# Patient Record
Sex: Female | Born: 1955 | Race: White | Hispanic: No | Smoking: Current every day smoker
Health system: Southern US, Community
[De-identification: ages and names within clinical notes are randomized; demographics above are authoritative.]

## PROBLEM LIST (undated history)

## (undated) DIAGNOSIS — J984 Other disorders of lung: Secondary | ICD-10-CM

## (undated) DIAGNOSIS — M1990 Unspecified osteoarthritis, unspecified site: Secondary | ICD-10-CM

## (undated) DIAGNOSIS — E079 Disorder of thyroid, unspecified: Secondary | ICD-10-CM

## (undated) DIAGNOSIS — K219 Gastro-esophageal reflux disease without esophagitis: Secondary | ICD-10-CM

## (undated) DIAGNOSIS — H53149 Visual discomfort, unspecified: Secondary | ICD-10-CM

## (undated) DIAGNOSIS — F32A Depression, unspecified: Secondary | ICD-10-CM

## (undated) DIAGNOSIS — G43909 Migraine, unspecified, not intractable, without status migrainosus: Secondary | ICD-10-CM

## (undated) DIAGNOSIS — E7800 Pure hypercholesterolemia, unspecified: Secondary | ICD-10-CM

## (undated) DIAGNOSIS — M797 Fibromyalgia: Secondary | ICD-10-CM

## (undated) DIAGNOSIS — G8929 Other chronic pain: Secondary | ICD-10-CM

## (undated) DIAGNOSIS — I509 Heart failure, unspecified: Secondary | ICD-10-CM

## (undated) DIAGNOSIS — I10 Essential (primary) hypertension: Secondary | ICD-10-CM

## (undated) DIAGNOSIS — F329 Major depressive disorder, single episode, unspecified: Secondary | ICD-10-CM

## (undated) DIAGNOSIS — M545 Low back pain, unspecified: Secondary | ICD-10-CM

## (undated) DIAGNOSIS — Z950 Presence of cardiac pacemaker: Secondary | ICD-10-CM

## (undated) DIAGNOSIS — J449 Chronic obstructive pulmonary disease, unspecified: Secondary | ICD-10-CM

## (undated) DIAGNOSIS — R0602 Shortness of breath: Secondary | ICD-10-CM

---
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
## 1991-03-31 HISTORY — PX: ABDOMINAL HYSTERECTOMY: SHX81

## 1998-08-19 ENCOUNTER — Inpatient Hospital Stay (HOSPITAL_COMMUNITY): Admission: EM | Admit: 1998-08-19 | Discharge: 1998-08-23 | Payer: Self-pay | Admitting: Psychiatry

## 1998-08-26 ENCOUNTER — Encounter (HOSPITAL_COMMUNITY): Admission: RE | Admit: 1998-08-26 | Discharge: 1998-09-02 | Payer: Self-pay | Admitting: Psychiatry

## 1998-11-29 HISTORY — PX: CARPAL TUNNEL RELEASE: SHX101

## 1998-11-29 HISTORY — PX: ROUX-EN-Y GASTRIC BYPASS: SHX1104

## 1998-11-29 HISTORY — PX: THYROIDECTOMY, PARTIAL: SHX18

## 2000-01-21 ENCOUNTER — Emergency Department (HOSPITAL_COMMUNITY): Admission: EM | Admit: 2000-01-21 | Discharge: 2000-01-21 | Payer: Self-pay | Admitting: Emergency Medicine

## 2000-05-03 ENCOUNTER — Encounter: Payer: Self-pay | Admitting: Emergency Medicine

## 2000-05-03 ENCOUNTER — Emergency Department (HOSPITAL_COMMUNITY): Admission: EM | Admit: 2000-05-03 | Discharge: 2000-05-03 | Payer: Self-pay | Admitting: Emergency Medicine

## 2000-07-16 ENCOUNTER — Other Ambulatory Visit (HOSPITAL_COMMUNITY): Admission: RE | Admit: 2000-07-16 | Discharge: 2000-07-26 | Payer: Self-pay | Admitting: Psychiatry

## 2001-06-02 ENCOUNTER — Ambulatory Visit (HOSPITAL_BASED_OUTPATIENT_CLINIC_OR_DEPARTMENT_OTHER): Admission: RE | Admit: 2001-06-02 | Discharge: 2001-06-02 | Payer: Self-pay | Admitting: Orthopedic Surgery

## 2001-09-27 ENCOUNTER — Encounter: Payer: Self-pay | Admitting: Emergency Medicine

## 2001-09-27 ENCOUNTER — Emergency Department (HOSPITAL_COMMUNITY): Admission: EM | Admit: 2001-09-27 | Discharge: 2001-09-27 | Payer: Self-pay | Admitting: Emergency Medicine

## 2001-12-20 ENCOUNTER — Ambulatory Visit (HOSPITAL_BASED_OUTPATIENT_CLINIC_OR_DEPARTMENT_OTHER): Admission: RE | Admit: 2001-12-20 | Discharge: 2001-12-20 | Payer: Self-pay | Admitting: Internal Medicine

## 2002-01-31 ENCOUNTER — Encounter: Payer: Self-pay | Admitting: Emergency Medicine

## 2002-01-31 ENCOUNTER — Inpatient Hospital Stay (HOSPITAL_COMMUNITY): Admission: EM | Admit: 2002-01-31 | Discharge: 2002-02-01 | Payer: Self-pay | Admitting: Emergency Medicine

## 2002-02-01 ENCOUNTER — Encounter: Payer: Self-pay | Admitting: Cardiovascular Disease

## 2002-03-01 ENCOUNTER — Encounter: Admission: RE | Admit: 2002-03-01 | Discharge: 2002-05-30 | Payer: Self-pay | Admitting: Internal Medicine

## 2002-03-03 ENCOUNTER — Encounter: Payer: Self-pay | Admitting: Surgery

## 2002-03-08 ENCOUNTER — Ambulatory Visit (HOSPITAL_COMMUNITY): Admission: RE | Admit: 2002-03-08 | Discharge: 2002-03-09 | Payer: Self-pay | Admitting: Otolaryngology

## 2002-04-19 ENCOUNTER — Ambulatory Visit (HOSPITAL_BASED_OUTPATIENT_CLINIC_OR_DEPARTMENT_OTHER): Admission: RE | Admit: 2002-04-19 | Discharge: 2002-04-19 | Payer: Self-pay | Admitting: Internal Medicine

## 2002-09-28 ENCOUNTER — Encounter: Payer: Self-pay | Admitting: Cardiology

## 2002-09-28 ENCOUNTER — Inpatient Hospital Stay (HOSPITAL_COMMUNITY): Admission: EM | Admit: 2002-09-28 | Discharge: 2002-09-30 | Payer: Self-pay | Admitting: Emergency Medicine

## 2002-09-29 ENCOUNTER — Encounter: Payer: Self-pay | Admitting: Cardiology

## 2002-10-30 ENCOUNTER — Encounter: Payer: Self-pay | Admitting: Emergency Medicine

## 2002-10-30 ENCOUNTER — Emergency Department (HOSPITAL_COMMUNITY): Admission: EM | Admit: 2002-10-30 | Discharge: 2002-10-30 | Payer: Self-pay | Admitting: Emergency Medicine

## 2002-11-10 ENCOUNTER — Encounter: Payer: Self-pay | Admitting: Emergency Medicine

## 2002-11-10 ENCOUNTER — Emergency Department (HOSPITAL_COMMUNITY): Admission: EM | Admit: 2002-11-10 | Discharge: 2002-11-10 | Payer: Self-pay | Admitting: Emergency Medicine

## 2003-03-21 ENCOUNTER — Ambulatory Visit (HOSPITAL_COMMUNITY): Admission: RE | Admit: 2003-03-21 | Discharge: 2003-03-21 | Payer: Self-pay | Admitting: Internal Medicine

## 2003-07-06 ENCOUNTER — Emergency Department (HOSPITAL_COMMUNITY): Admission: EM | Admit: 2003-07-06 | Discharge: 2003-07-06 | Payer: Self-pay | Admitting: Emergency Medicine

## 2003-09-12 ENCOUNTER — Ambulatory Visit (HOSPITAL_COMMUNITY): Admission: RE | Admit: 2003-09-12 | Discharge: 2003-09-12 | Payer: Self-pay | Admitting: *Deleted

## 2003-09-17 ENCOUNTER — Ambulatory Visit (HOSPITAL_COMMUNITY): Admission: RE | Admit: 2003-09-17 | Discharge: 2003-09-17 | Payer: Self-pay | Admitting: Gastroenterology

## 2003-09-25 ENCOUNTER — Encounter: Admission: RE | Admit: 2003-09-25 | Discharge: 2003-12-24 | Payer: Self-pay | Admitting: *Deleted

## 2004-01-01 ENCOUNTER — Inpatient Hospital Stay (HOSPITAL_COMMUNITY): Admission: RE | Admit: 2004-01-01 | Discharge: 2004-01-06 | Payer: Self-pay | Admitting: *Deleted

## 2004-01-09 ENCOUNTER — Encounter: Admission: RE | Admit: 2004-01-09 | Discharge: 2004-03-26 | Payer: Self-pay | Admitting: *Deleted

## 2004-05-26 ENCOUNTER — Ambulatory Visit (HOSPITAL_COMMUNITY): Admission: RE | Admit: 2004-05-26 | Discharge: 2004-05-26 | Payer: Self-pay | Admitting: Internal Medicine

## 2004-06-18 ENCOUNTER — Encounter: Admission: RE | Admit: 2004-06-18 | Discharge: 2004-09-16 | Payer: Self-pay | Admitting: *Deleted

## 2011-03-21 ENCOUNTER — Emergency Department (HOSPITAL_COMMUNITY)
Admission: EM | Admit: 2011-03-21 | Discharge: 2011-03-21 | Disposition: A | Discharge status: Home / Self Care | Payer: Self-pay | Attending: Emergency Medicine | Admitting: Emergency Medicine

## 2011-03-21 ENCOUNTER — Encounter: Payer: Self-pay | Admitting: *Deleted

## 2011-03-21 ENCOUNTER — Emergency Department (HOSPITAL_COMMUNITY): Payer: Self-pay

## 2011-03-21 DIAGNOSIS — R0602 Shortness of breath: Secondary | ICD-10-CM | POA: Insufficient documentation

## 2011-03-21 DIAGNOSIS — R509 Fever, unspecified: Secondary | ICD-10-CM | POA: Insufficient documentation

## 2011-03-21 DIAGNOSIS — R059 Cough, unspecified: Secondary | ICD-10-CM | POA: Insufficient documentation

## 2011-03-21 DIAGNOSIS — R5383 Other fatigue: Secondary | ICD-10-CM | POA: Insufficient documentation

## 2011-03-21 DIAGNOSIS — J40 Bronchitis, not specified as acute or chronic: Secondary | ICD-10-CM | POA: Insufficient documentation

## 2011-03-21 DIAGNOSIS — R05 Cough: Secondary | ICD-10-CM | POA: Insufficient documentation

## 2011-03-21 DIAGNOSIS — R5381 Other malaise: Secondary | ICD-10-CM | POA: Insufficient documentation

## 2011-03-21 HISTORY — DX: Unspecified osteoarthritis, unspecified site: M19.90

## 2011-03-21 HISTORY — DX: Disorder of thyroid, unspecified: E07.9

## 2011-03-21 MED ORDER — HYDROCODONE-ACETAMINOPHEN 5-325 MG PO TABS: 2.0000 | ORAL_TABLET | ORAL | Status: AC | PRN

## 2011-03-21 MED ORDER — BENZONATATE 100 MG PO CAPS: 100.0000 mg | ORAL_CAPSULE | Freq: Three times a day (TID) | ORAL | Status: DC

## 2011-03-21 MED ORDER — AZITHROMYCIN 250 MG PO TABS: 250.0000 mg | ORAL_TABLET | Freq: Every day | ORAL | Status: AC

## 2011-03-21 MED ORDER — BENZONATATE 100 MG PO CAPS: 100.0000 mg | ORAL_CAPSULE | Freq: Three times a day (TID) | ORAL | Status: AC

## 2011-03-21 MED ORDER — AZITHROMYCIN 250 MG PO TABS: 250.0000 mg | ORAL_TABLET | Freq: Every day | ORAL | Status: DC

## 2011-03-21 NOTE — ED Provider Notes (Signed)
Medical screening examination/treatment/procedure(s) were performed by non-physician practitioner and as supervising physician I was immediately available for consultation/collaboration.   Laquida Cotrell R Dannica Bickham, MD 03/21/11 1641 

## 2011-03-21 NOTE — ED Provider Notes (Signed)
History     CSN: [id number]  Arrival date & time 03/21/11  1026   First MD Initiated Contact with Patient 03/21/11 1034      Chief Complaint  Patient presents with  . Fever  . Weakness  . Shortness of Breath    (Consider location/radiation/quality/duration/timing/severity/associated sxs/prior treatment) Patient is a 55 y.o. female presenting with fever, weakness, and shortness of breath. The history is provided by the patient.  Fever Primary symptoms of the febrile illness include fever and shortness of breath.  Weakness The primary symptoms include fever.  Additional symptoms include weakness.  Shortness of Breath  Associated symptoms include a fever and shortness of breath.    Since Thursday night the patient has been coughing, having fevers and chills, and body aches all over. She denies N/V/D, sore throat an ear pains. She has not taken her temperature but believes that she feels warm to the touch. She also states that she feels weak and "just plain bad". Pt is otherwise healthy.  Past Medical History  Diagnosis Date  . Thyroid disease   . Asthma   . Arthritis     Past Surgical History  Procedure Date  . Abdominal surgery   . Gastric bypass   . 1/2 of thyroid removed     Tumor was benign  . Bilateral carpal tunnel release   . Abdominal hysterectomy   . 2 c-sections   . Appendectomy     Family History  Problem Relation Age of Onset  . Cancer Mother   . Hyperlipidemia Mother   . Heart failure Mother     History  Substance Use Topics  . Smoking status: Current Everyday Smoker    Types: Cigarettes  . Smokeless tobacco: Never Used  . Alcohol Use: Yes     occ    OB History    Grav Para Term Preterm Abortions TAB SAB Ect Mult Living                  Review of Systems  Constitutional: Positive for fever.  Respiratory: Positive for shortness of breath.   Neurological: Positive for weakness.  All other systems reviewed and are  negative.    Allergies  Review of patient's allergies indicates no known allergies.  Home Medications  No current outpatient prescriptions on file.  BP 139/51  Pulse 66  Temp(Src) 98.9 F (37.2 C) (Oral)  Resp 18  SpO2 96%  Physical Exam  Constitutional: She is oriented to person, place, and time. She appears well-developed and well-nourished.  HENT:  Head: Normocephalic and atraumatic.  Eyes: Conjunctivae are normal. Pupils are equal, round, and reactive to light.  Neck: Trachea normal, normal range of motion and full passive range of motion without pain. Neck supple.  Cardiovascular: Normal rate, regular rhythm and normal pulses.   Pulmonary/Chest: Effort normal and breath sounds normal. No respiratory distress.   Chest wall is not dull to percussion. She exhibits no tenderness, no crepitus, no edema, no deformity and no retraction.  Abdominal: Soft. Normal appearance and bowel sounds are normal.  Musculoskeletal: Normal range of motion.  Neurological: She is alert and oriented to person, place, and time. She has normal strength.  Skin: Skin is warm, dry and intact.  Psychiatric: She has a normal mood and affect. Her speech is normal and behavior is normal. Judgment and thought content normal. Cognition and memory are normal.    ED Course  Procedures (including critical care time)  Labs Reviewed - No data to  display No results found.   1. Flu-like symptoms       MDM  Will treat patients symptoms and have her follow-up with her ED if she has any complications.        Dorthula Matas, PA 03/21/11 1217

## 2011-03-21 NOTE — ED Notes (Signed)
Patient returned from X-ray 

## 2011-06-20 ENCOUNTER — Emergency Department (HOSPITAL_COMMUNITY): Payer: Self-pay

## 2011-06-20 ENCOUNTER — Encounter (HOSPITAL_COMMUNITY): Payer: Self-pay | Admitting: *Deleted

## 2011-06-20 ENCOUNTER — Emergency Department (HOSPITAL_COMMUNITY)
Admission: EM | Admit: 2011-06-20 | Discharge: 2011-06-21 | Disposition: A | Discharge status: Home / Self Care | Payer: Self-pay | Attending: Emergency Medicine | Admitting: Emergency Medicine

## 2011-06-20 DIAGNOSIS — N12 Tubulo-interstitial nephritis, not specified as acute or chronic: Secondary | ICD-10-CM | POA: Insufficient documentation

## 2011-06-20 DIAGNOSIS — R300 Dysuria: Secondary | ICD-10-CM | POA: Insufficient documentation

## 2011-06-20 DIAGNOSIS — K8020 Calculus of gallbladder without cholecystitis without obstruction: Secondary | ICD-10-CM | POA: Insufficient documentation

## 2011-06-20 DIAGNOSIS — R911 Solitary pulmonary nodule: Secondary | ICD-10-CM | POA: Insufficient documentation

## 2011-06-20 DIAGNOSIS — J45909 Unspecified asthma, uncomplicated: Secondary | ICD-10-CM | POA: Insufficient documentation

## 2011-06-20 DIAGNOSIS — Z79899 Other long term (current) drug therapy: Secondary | ICD-10-CM | POA: Insufficient documentation

## 2011-06-20 DIAGNOSIS — E079 Disorder of thyroid, unspecified: Secondary | ICD-10-CM | POA: Insufficient documentation

## 2011-06-20 DIAGNOSIS — Z8739 Personal history of other diseases of the musculoskeletal system and connective tissue: Secondary | ICD-10-CM | POA: Insufficient documentation

## 2011-06-20 DIAGNOSIS — R109 Unspecified abdominal pain: Secondary | ICD-10-CM | POA: Insufficient documentation

## 2011-06-20 LAB — POCT I-STAT, CHEM 8
Creatinine, Ser: 0.7 mg/dL (ref 0.50–1.10)
Hemoglobin: 14.6 g/dL (ref 12.0–15.0)
Sodium: 144 mEq/L (ref 135–145)
TCO2: 26 mmol/L (ref 0–100)

## 2011-06-20 LAB — URINALYSIS, ROUTINE W REFLEX MICROSCOPIC
Glucose, UA: NEGATIVE mg/dL
Specific Gravity, Urine: 1.01 (ref 1.005–1.030)
pH: 7 (ref 5.0–8.0)

## 2011-06-20 LAB — URINE MICROSCOPIC-ADD ON

## 2011-06-20 MED ORDER — MORPHINE SULFATE 4 MG/ML IJ SOLN
4.0000 mg | Freq: Once | INTRAMUSCULAR | Status: AC
  Administered 2011-06-20: 4 mg via INTRAVENOUS
  Filled 2011-06-20: qty 1

## 2011-06-20 MED ORDER — DEXTROSE 5 % IV SOLN
1.0000 g | Freq: Once | INTRAVENOUS | Status: AC
  Administered 2011-06-20: 1 g via INTRAVENOUS
  Filled 2011-06-20: qty 10

## 2011-06-20 MED ORDER — ONDANSETRON HCL 4 MG/2ML IJ SOLN
4.0000 mg | Freq: Once | INTRAMUSCULAR | Status: AC
  Administered 2011-06-20: 4 mg via INTRAVENOUS
  Filled 2011-06-20: qty 2

## 2011-06-20 MED ORDER — SODIUM CHLORIDE 0.9 % IV BOLUS (SEPSIS)
1000.0000 mL | Freq: Once | INTRAVENOUS | Status: AC
  Administered 2011-06-20: 1000 mL via INTRAVENOUS

## 2011-06-20 NOTE — ED Provider Notes (Signed)
History     CSN: [id number]  Arrival date & time 06/20/11  2011   First MD Initiated Contact with Patient 06/20/11 2315      Chief Complaint  Patient presents with  . Abdominal Pain  . Dysuria    (Consider location/radiation/quality/duration/timing/severity/associated sxs/prior treatment) HPI Comments: Patient presents with lower abdominal pain has been intermittent for the past 10 days. Today he acutely got worse a move from her right side of her abdomen to her suprapubic area. She had multiple episodes of painful urination, frequency and urgency. She denies any nausea, vomiting, fever. No diarrhea or change in bowel habits. She's a history of gastric bypass surgery and appendectomy. She denies any chest pain or shortness of breath. She has had a dry intermittent cough for the past week that has exacerbated her abdominal pain. She describes her pain as "bladder spasms" nothing makes it better or worse.  The history is provided by the patient.    Past Medical History  Diagnosis Date  . Thyroid disease   . Asthma   . Arthritis     Past Surgical History  Procedure Date  . Abdominal surgery   . Gastric bypass   . 1/2 of thyroid removed     Tumor was benign  . Bilateral carpal tunnel release   . Abdominal hysterectomy   . 2 c-sections   . Appendectomy     Family History  Problem Relation Age of Onset  . Cancer Mother   . Hyperlipidemia Mother   . Heart failure Mother     History  Substance Use Topics  . Smoking status: Current Everyday Smoker    Types: Cigarettes  . Smokeless tobacco: Never Used  . Alcohol Use: Yes     occ    OB History    Grav Para Term Preterm Abortions TAB SAB Ect Mult Living                  Review of Systems  Constitutional: Positive for fever, activity change and appetite change.  HENT: Negative for congestion, sore throat and rhinorrhea.   Eyes: Negative for visual disturbance.  Respiratory: Positive for cough. Negative for chest  tightness and shortness of breath.   Cardiovascular: Negative for chest pain.  Gastrointestinal: Positive for abdominal pain. Negative for nausea, vomiting and diarrhea.  Genitourinary: Positive for dysuria and hematuria.  Musculoskeletal: Negative for back pain.  Neurological: Negative for headaches.    Allergies  Review of patient's allergies indicates no known allergies.  Home Medications   Current Outpatient Rx  Name Route Sig Dispense Refill  . CETIRIZINE HCL 10 MG PO TABS Oral Take 10 mg by mouth daily.      Marland Kitchen HYDROCORTISONE 1 % EX CREA Topical Apply 1 application topically as needed. For psoriasis.     . IBUPROFEN 200 MG PO TABS Oral Take 200 mg by mouth every 8 (eight) hours as needed. For fever or pain.     Carma Leaven M PLUS PO TABS Oral Take 1 tablet by mouth daily.      Marland Kitchen OVER THE COUNTER MEDICATION Oral Take 1 tablet by mouth daily. Vitamin B-12.     Marland Kitchen OVER THE COUNTER MEDICATION Oral Take 1 tablet by mouth daily. Calcium tablet.     Marland Kitchen OVER THE COUNTER MEDICATION Oral Take 1 tablet by mouth daily. Vitamin D.       BP 127/50  Pulse 63  Temp(Src) 98.5 F (36.9 C) (Oral)  Resp 17  SpO2 100%  Physical Exam  Constitutional: She is oriented to person, place, and time. She appears well-developed and well-nourished. No distress.  HENT:  Head: Normocephalic and atraumatic.  Mouth/Throat: No oropharyngeal exudate.  Eyes: Conjunctivae and EOM are normal. Pupils are equal, round, and reactive to light.  Neck: Normal range of motion.  Cardiovascular: Normal rate, regular rhythm and normal heart sounds.   No murmur heard. Pulmonary/Chest: Effort normal and breath sounds normal. No respiratory distress. She exhibits no tenderness.  Abdominal: Soft. There is tenderness. There is guarding.       Suprapubic and right lower quadrant tenderness with guarding. Negative McBurney's tenderness, negative Rovsing  Musculoskeletal: She exhibits tenderness.       Left CVA tenderness    Neurological: She is alert and oriented to person, place, and time. No cranial nerve deficit.  Skin: Skin is warm.    ED Course  Procedures (including critical care time)  Labs Reviewed  URINALYSIS, ROUTINE W REFLEX MICROSCOPIC - Abnormal; Notable for the following:    APPearance CLOUDY (*)    Hgb urine dipstick LARGE (*)    Leukocytes, UA LARGE (*)    All other components within normal limits  URINE MICROSCOPIC-ADD ON - Abnormal; Notable for the following:    Bacteria, UA MANY (*)    All other components within normal limits  POCT I-STAT, CHEM 8 - Abnormal; Notable for the following:    Calcium, Ion 1.34 (*)    All other components within normal limits   Dg Chest 2 View  06/21/2011  *RADIOLOGY REPORT*  Clinical Data: Cough.  Chest pain.  Asthma.  CHEST - 2 VIEW  Comparison:  03/21/2011  Findings:  Lordotic positioning noted on the PA view.  The heart size and mediastinal contours are within normal limits.  Both lungs are clear.  The visualized skeletal structures are unremarkable.  IMPRESSION: No active cardiopulmonary disease.  Original Report Authenticated By: Danae Orleans, M.D.   Ct Abdomen Pelvis W Contrast  06/21/2011  *RADIOLOGY REPORT*  Clinical Data: Pain, dysuria, history of gastric bypass  CT ABDOMEN AND PELVIS WITH CONTRAST  Technique:  Multidetector CT imaging of the abdomen and pelvis was performed following the standard protocol during bolus administration of intravenous contrast. Sagittal and coronal MPR images reconstructed from axial data set.  Contrast:  Dilute oral contrast. 100 ml Omnipaque 300 IV.  Comparison: None  Findings: 4 mm right lower lobe nodule image 1. Prior gastric surgery by history gastric bypass. Cholelithiasis without biliary dilatation. Patchy areas of abnormal nephrogram within both kidneys, raising question of pyelonephritis. Sharp margination of this lesion on delayed images favors mass. Liver, spleen, pancreas, kidneys, and adrenal glands  otherwise normal appearance. Stomach decompressed.  Short segment of small bowel enteroenteric intussusception in the jejunum in the left upper quadrant. Splenule inferior to spleen. Scattered atherosclerotic calcification. Uterus surgically absent bilateral atrophic ovaries. Appendix surgically absent.  Diffuse bladder wall thickening, fairly uniform, favor inflammatory process over tumor. Left inguinal hernia containing fat. No mass, adenopathy, free fluid or free air. Bones appear demineralized. Minimal osseous contents and iliac.  IMPRESSION: Cholelithiasis. Diffuse bladder wall thickening, favor inflammatory process but tumor is not excluded; recommend correlation with urinalysis and questionably follow-up cystoscopy. Short segment of small bowel enteroenteric intussusception in the jejunum in the left upper quadrant. Left inguinal hernia containing fat. Foci of abnormal nephrograms in both kidneys question pyelonephritis, recommend correlation urinalysis. 4 mm nonspecific right lower lobe pulmonary nodule, recommendation below.  If the patient is at high  risk for bronchogenic carcinoma, follow- up chest CT at 1 year is recommended.  If the patient is at low risk, no follow-up is needed.  This recommendation follows the consensus statement: Guidelines for Management of Small Pulmonary Nodules Detected on CT Scans:  A Statement from the Fleischner Society as published in Radiology 2005; 237:395-400.  Original Report Authenticated By: Lollie Marrow, M.D.     No diagnosis found.    MDM  Lower abdominal pain with dysuria and frequency. Vitals stable, abdomen soft, suprapubic tenderness.  Evaluate for UTI versus pyelonephritis versus nephrolithiasis.  Urinalysis infected. Treat for pyelonephritis. Given patient's history of gastric bypass as well as diffuse abdominal pain CT imaging was obtained. This is consistent with inflammation of the bladder and kidneys. She'll also is noted to have an incidental  jejunal intussusception. I discussed these findings with the general surgeon on call Dr. Purnell Shoemaker.  He states this is likely incidental peristalsis this patient is tolerating by mouth and has no clinical signs of bloating or obstruction. Patient will be instructed on warning signs to return. Patient is informed of these findings as well as lung nodule.     Glynn Octave, MD 06/21/11 [phone]

## 2011-06-20 NOTE — ED Notes (Signed)
Pt states cough x 1 week. Starting yesterday, every episode of coughing caused lower abdominal pain. Tonight, pt c/o dysuria and bladder spasms.

## 2011-06-21 MED ORDER — HYDROCODONE-ACETAMINOPHEN 5-325 MG PO TABS: 2.0000 | ORAL_TABLET | ORAL | Status: AC | PRN

## 2011-06-21 MED ORDER — ONDANSETRON HCL 4 MG PO TABS: 4.0000 mg | ORAL_TABLET | Freq: Four times a day (QID) | ORAL | Status: AC

## 2011-06-21 MED ORDER — CEPHALEXIN 500 MG PO CAPS: 500.0000 mg | ORAL_CAPSULE | Freq: Four times a day (QID) | ORAL | Status: AC

## 2011-06-21 MED ORDER — IOHEXOL 300 MG/ML  SOLN
100.0000 mL | Freq: Once | INTRAMUSCULAR | Status: AC | PRN
  Administered 2011-06-21: 100 mL via INTRAVENOUS

## 2011-06-21 MED ORDER — MORPHINE SULFATE 4 MG/ML IJ SOLN
4.0000 mg | Freq: Once | INTRAMUSCULAR | Status: AC
  Administered 2011-06-21: 4 mg via INTRAVENOUS
  Filled 2011-06-21: qty 1

## 2011-06-21 NOTE — ED Notes (Signed)
Patient given discharge instructions, information, prescriptions, and diet order. Patient states that they adequately understand discharge information given and to return to ED if symptoms return or worsen.     

## 2011-06-21 NOTE — Discharge Instructions (Signed)
Take the antibiotics as prescribed for a kidney infection. You have a lung nodule on her CT scan that needs to be followed up in one year. Stop smoking.  As we talked about her CT findings, return to the ED she developed abdominal bloating, vomiting, inability to pass stool, fever or any other symptoms.  Pyelonephritis, Adult Pyelonephritis is a kidney infection. In general, there are 2 main types of pyelonephritis:  Infections that come on quickly without any warning (acute pyelonephritis).   Infections that persist for a long period of time (chronic pyelonephritis).  CAUSES  Two main causes of pyelonephritis are:  Bacteria traveling from the bladder to the kidney. This is a problem especially in pregnant women. The urine in the bladder can become filled with bacteria from multiple causes, including:   Inflammation of the prostate gland (prostatitis).   Sexual intercourse in females.   Bladder infection (cystitis).   Bacteria traveling from the bloodstream to the tissue part of the kidney.  Problems that may increase your risk of getting a kidney infection include:  Diabetes.   Kidney stones or bladder stones.   Cancer.   Catheters placed in the bladder.   Other abnormalities of the kidney or ureter.  SYMPTOMS   Abdominal pain.   Pain in the side or flank area.   Fever.   Chills.   Upset stomach.   Blood in the urine (dark urine).   Frequent urination.   Strong or persistent urge to urinate.   Burning or stinging when urinating.  DIAGNOSIS  Your caregiver may diagnose your kidney infection based on your symptoms. A urine sample may also be taken. TREATMENT  In general, treatment depends on how severe the infection is.   If the infection is mild and caught early, your caregiver may treat you with oral antibiotics and send you home.   If the infection is more severe, the bacteria may have gotten into the bloodstream. This will require intravenous (IV)  antibiotics and a hospital stay. Symptoms may include:   High fever.   Severe flank pain.   Shaking chills.   Even after a hospital stay, your caregiver may require you to be on oral antibiotics for a period of time.   Other treatments may be required depending upon the cause of the infection.  HOME CARE INSTRUCTIONS   Take your antibiotics as directed. Finish them even if you start to feel better.   Make an appointment to have your urine checked to make sure the infection is gone.   Drink enough fluids to keep your urine clear or pale yellow.   Take medicines for the bladder if you have urgency and frequency of urination as directed by your caregiver.  SEEK IMMEDIATE MEDICAL CARE IF:   You have a fever.   You are unable to take your antibiotics or fluids.   You develop shaking chills.   You experience extreme weakness or fainting.   There is no improvement after 2 days of treatment.  MAKE SURE YOU:  Understand these instructions.   Will watch your condition.   Will get help right away if you are not doing well or get worse.  Document Released: 03/16/2005 Document Revised: 03/05/2011 Document Reviewed: 08/20/2010 [facility] Patient Information 2012 [place], [place].

## 2011-06-21 NOTE — ED Notes (Signed)
Patient transported to X-ray 

## 2011-07-27 ENCOUNTER — Emergency Department (HOSPITAL_COMMUNITY)
Admission: EM | Admit: 2011-07-27 | Discharge: 2011-07-27 | Disposition: A | Discharge status: Home / Self Care | Payer: Self-pay | Attending: Emergency Medicine | Admitting: Emergency Medicine

## 2011-07-27 ENCOUNTER — Encounter (HOSPITAL_COMMUNITY): Payer: Self-pay | Admitting: *Deleted

## 2011-07-27 DIAGNOSIS — R109 Unspecified abdominal pain: Secondary | ICD-10-CM | POA: Insufficient documentation

## 2011-07-27 DIAGNOSIS — E079 Disorder of thyroid, unspecified: Secondary | ICD-10-CM | POA: Insufficient documentation

## 2011-07-27 DIAGNOSIS — Z9889 Other specified postprocedural states: Secondary | ICD-10-CM | POA: Insufficient documentation

## 2011-07-27 DIAGNOSIS — R350 Frequency of micturition: Secondary | ICD-10-CM | POA: Insufficient documentation

## 2011-07-27 DIAGNOSIS — N12 Tubulo-interstitial nephritis, not specified as acute or chronic: Secondary | ICD-10-CM | POA: Insufficient documentation

## 2011-07-27 DIAGNOSIS — M129 Arthropathy, unspecified: Secondary | ICD-10-CM | POA: Insufficient documentation

## 2011-07-27 DIAGNOSIS — R3915 Urgency of urination: Secondary | ICD-10-CM | POA: Insufficient documentation

## 2011-07-27 DIAGNOSIS — F17200 Nicotine dependence, unspecified, uncomplicated: Secondary | ICD-10-CM | POA: Insufficient documentation

## 2011-07-27 DIAGNOSIS — R6883 Chills (without fever): Secondary | ICD-10-CM | POA: Insufficient documentation

## 2011-07-27 DIAGNOSIS — Z79899 Other long term (current) drug therapy: Secondary | ICD-10-CM | POA: Insufficient documentation

## 2011-07-27 DIAGNOSIS — M545 Low back pain, unspecified: Secondary | ICD-10-CM | POA: Insufficient documentation

## 2011-07-27 DIAGNOSIS — R300 Dysuria: Secondary | ICD-10-CM | POA: Insufficient documentation

## 2011-07-27 DIAGNOSIS — J45909 Unspecified asthma, uncomplicated: Secondary | ICD-10-CM | POA: Insufficient documentation

## 2011-07-27 LAB — DIFFERENTIAL
Eosinophils Absolute: 0.5 10*3/uL (ref 0.0–0.7)
Eosinophils Relative: 6 % — ABNORMAL HIGH (ref 0–5)
Lymphs Abs: 2.7 10*3/uL (ref 0.7–4.0)
Monocytes Relative: 6 % (ref 3–12)

## 2011-07-27 LAB — URINALYSIS, ROUTINE W REFLEX MICROSCOPIC
Bilirubin Urine: NEGATIVE
Ketones, ur: NEGATIVE mg/dL
Nitrite: POSITIVE — AB
Urobilinogen, UA: 0.2 mg/dL (ref 0.0–1.0)

## 2011-07-27 LAB — POCT I-STAT, CHEM 8
Creatinine, Ser: 0.6 mg/dL (ref 0.50–1.10)
Glucose, Bld: 99 mg/dL (ref 70–99)
Hemoglobin: 14.3 g/dL (ref 12.0–15.0)
Potassium: 4 mEq/L (ref 3.5–5.1)

## 2011-07-27 LAB — CBC
HCT: 41.4 % (ref 36.0–46.0)
Hemoglobin: 13.7 g/dL (ref 12.0–15.0)
MCH: 29.8 pg (ref 26.0–34.0)
MCV: 90.2 fL (ref 78.0–100.0)
RBC: 4.59 MIL/uL (ref 3.87–5.11)

## 2011-07-27 MED ORDER — CIPROFLOXACIN HCL 500 MG PO TABS: 500.0000 mg | ORAL_TABLET | Freq: Two times a day (BID) | ORAL | Status: AC

## 2011-07-27 MED ORDER — DEXTROSE 5 % IV SOLN
1.0000 g | Freq: Once | INTRAVENOUS | Status: AC
  Administered 2011-07-27: 1 g via INTRAVENOUS
  Filled 2011-07-27: qty 10

## 2011-07-27 MED ORDER — ACETAMINOPHEN 325 MG PO TABS
650.0000 mg | ORAL_TABLET | Freq: Once | ORAL | Status: AC
  Administered 2011-07-27: 650 mg via ORAL
  Filled 2011-07-27: qty 2

## 2011-07-27 MED ORDER — OXYCODONE-ACETAMINOPHEN 5-325 MG PO TABS: 1.0000 | ORAL_TABLET | Freq: Four times a day (QID) | ORAL | Status: AC | PRN

## 2011-07-27 MED ORDER — SODIUM CHLORIDE 0.9 % IV SOLN
Freq: Once | INTRAVENOUS | Status: AC
  Administered 2011-07-27: 17:00:00 via INTRAVENOUS

## 2011-07-27 NOTE — Progress Notes (Signed)
CM ED noted no pcp She confirmed no pcp [place] resident States she has tried to get into [facility] and [facility] Reviewed other programs facilities available with use of orange card and provided written copy of self pay pcps, housing, crisis programs, DSS, and health dept.

## 2011-07-27 NOTE — ED Notes (Signed)
Pt reports she was here x1 month ago and dx with kidney infection. States symptoms have been returning but are not as bad as the first time. Reports burning with urination and left flank pain rated 5/10. Pt is A/O x4. Skin warm and dry. Respirations even and unlabored. NAD noted at this time.

## 2011-07-27 NOTE — ED Notes (Signed)
MD at bedside. 

## 2011-07-27 NOTE — Progress Notes (Signed)
Provided pt with [facility] application package information to complete to assist with orange card Pt states she has not been offered a package previous by [facility] Pt appreciative of resource

## 2011-07-27 NOTE — ED Provider Notes (Signed)
History     CSN: [id number]  Arrival date & time 07/27/11  1359   First MD Initiated Contact with Patient 07/27/11 1611      Chief Complaint  Patient presents with  . Pyelonephritis    (Consider location/radiation/quality/duration/timing/severity/associated sxs/prior treatment) The history is provided by the patient.   patient was seen in ER last month diagnoses bilateral pyelonephritis. She was discharged on Keflex after a Rocephin treatment. She states she done better, but around a week ago she began having the same symptoms. She's had frequency, incontinence and flank pain. She's also had dysuria. She's had some mild lower abdominal pain and lower back pain. She states the pain goes up to her bilateral flanks. No fevers. No lightheadedness or dizziness. No nausea or vomiting.  Past Medical History  Diagnosis Date  . Thyroid disease   . Asthma   . Arthritis     Past Surgical History  Procedure Date  . Abdominal surgery   . Gastric bypass   . 1/2 of thyroid removed     Tumor was benign  . Bilateral carpal tunnel release   . Abdominal hysterectomy   . 2 c-sections   . Appendectomy     Family History  Problem Relation Age of Onset  . Cancer Mother   . Hyperlipidemia Mother   . Heart failure Mother     History  Substance Use Topics  . Smoking status: Current Everyday Smoker    Types: Cigarettes  . Smokeless tobacco: Never Used  . Alcohol Use: Yes     occ    OB History    Grav Para Term Preterm Abortions TAB SAB Ect Mult Living                  Review of Systems  Constitutional: Positive for chills. Negative for activity change and appetite change.  HENT: Negative for neck stiffness.   Eyes: Negative for pain.  Respiratory: Negative for chest tightness and shortness of breath.   Cardiovascular: Negative for chest pain and leg swelling.  Gastrointestinal: Positive for abdominal pain. Negative for nausea, vomiting and diarrhea.  Genitourinary: Positive for  dysuria, urgency, frequency and flank pain.  Musculoskeletal: Negative for back pain.  Skin: Negative for rash.  Neurological: Negative for weakness, numbness and headaches.  Psychiatric/Behavioral: Negative for behavioral problems.    Allergies  Review of patient's allergies indicates no known allergies.  Home Medications   Current Outpatient Rx  Name Route Sig Dispense Refill  . ACETAMINOPHEN 500 MG PO TABS Oral Take 500 mg by mouth every 6 (six) hours as needed. For pain relief    . CALCIUM CARBONATE 600 MG PO TABS Oral Take 600 mg by mouth daily.    Marland Kitchen CETIRIZINE HCL 10 MG PO TABS Oral Take 10 mg by mouth daily.      Marland Kitchen VITAMIN D 1000 UNITS PO TABS Oral Take 1,000 Units by mouth daily.    Marland Kitchen CRANBERRY 450 MG PO TABS Oral Take 2 tablets by mouth daily.    Marland Kitchen HYDROCORTISONE 1 % EX CREA Topical Apply 1 application topically as needed. For psoriasis.     . IBUPROFEN 200 MG PO TABS Oral Take 200 mg by mouth every 8 (eight) hours as needed. For fever or pain.     Carma Leaven M PLUS PO TABS Oral Take 1 tablet by mouth daily.      Marland Kitchen VITAMIN B-12 1000 MCG PO TABS Oral Take 1,000 mcg by mouth daily.    Marland Kitchen  CIPROFLOXACIN HCL 500 MG PO TABS Oral Take 1 tablet (500 mg total) by mouth 2 (two) times daily. 28 tablet 0  . OXYCODONE-ACETAMINOPHEN 5-325 MG PO TABS Oral Take 1-2 tablets by mouth every 6 (six) hours as needed for pain. 10 tablet 0    BP 123/46  Pulse 63  Temp(Src) 98.2 F (36.8 C) (Oral)  Resp 16  SpO2 97%  Physical Exam  Nursing note and vitals reviewed. Constitutional: She is oriented to person, place, and time. She appears well-developed and well-nourished.  HENT:  Head: Normocephalic and atraumatic.  Eyes: EOM are normal. Pupils are equal, round, and reactive to light.  Neck: Normal range of motion. Neck supple.  Cardiovascular: Normal rate, regular rhythm and normal heart sounds.   No murmur heard. Pulmonary/Chest: Effort normal and breath sounds normal. No respiratory  distress. She has no wheezes. She has no rales.  Abdominal: Soft. Bowel sounds are normal. She exhibits no distension. There is no tenderness. There is no rebound and no guarding.  Genitourinary:       No CVA tenderness  Musculoskeletal: Normal range of motion.  Neurological: She is alert and oriented to person, place, and time. No cranial nerve deficit.  Skin: Skin is warm and dry.  Psychiatric: She has a normal mood and affect. Her speech is normal.    ED Course  Procedures (including critical care time)  Labs Reviewed  URINALYSIS, ROUTINE W REFLEX MICROSCOPIC - Abnormal; Notable for the following:    APPearance CLOUDY (*)    Hgb urine dipstick TRACE (*)    Nitrite POSITIVE (*)    Leukocytes, UA MODERATE (*)    All other components within normal limits  URINE MICROSCOPIC-ADD ON - Abnormal; Notable for the following:    Bacteria, UA MANY (*)    All other components within normal limits  DIFFERENTIAL - Abnormal; Notable for the following:    Eosinophils Relative 6 (*)    All other components within normal limits  POCT I-STAT, CHEM 8 - Abnormal; Notable for the following:    Calcium, Ion 1.42 (*)    All other components within normal limits  CBC   No results found.   1. Pyelonephritis       MDM  Patient with dysuria and bilateral flank pain. Patient had a CT diagnosed pyelonephritis a month ago. Her symptoms improved but then returned. She previously been on Rocephin and Keflex. Urinalysis again shows a UTI. She'll be treated as pyelonephritis. After discussion with Dr. Annabell Howells, the patient was started on Cipro and a culture was then sent. Another IV dose Rocephin was given. She'll followup as needed        Darlene Meyer. Rubin Payor, MD 07/27/11 1610

## 2011-07-27 NOTE — Discharge Instructions (Signed)
Pyelonephritis, Adult Pyelonephritis is a kidney infection. In general, there are 2 main types of pyelonephritis:  Infections that come on quickly without any warning (acute pyelonephritis).   Infections that persist for a long period of time (chronic pyelonephritis).  CAUSES  Two main causes of pyelonephritis are:  Bacteria traveling from the bladder to the kidney. This is a problem especially in pregnant women. The urine in the bladder can become filled with bacteria from multiple causes, including:   Inflammation of the prostate gland (prostatitis).   Sexual intercourse in females.   Bladder infection (cystitis).   Bacteria traveling from the bloodstream to the tissue part of the kidney.  Problems that may increase your risk of getting a kidney infection include:  Diabetes.   Kidney stones or bladder stones.   Cancer.   Catheters placed in the bladder.   Other abnormalities of the kidney or ureter.  SYMPTOMS   Abdominal pain.   Pain in the side or flank area.   Fever.   Chills.   Upset stomach.   Blood in the urine (dark urine).   Frequent urination.   Strong or persistent urge to urinate.   Burning or stinging when urinating.  DIAGNOSIS  Your caregiver may diagnose your kidney infection based on your symptoms. A urine sample may also be taken. TREATMENT  In general, treatment depends on how severe the infection is.   If the infection is mild and caught early, your caregiver may treat you with oral antibiotics and send you home.   If the infection is more severe, the bacteria may have gotten into the bloodstream. This will require intravenous (IV) antibiotics and a hospital stay. Symptoms may include:   High fever.   Severe flank pain.   Shaking chills.   Even after a hospital stay, your caregiver may require you to be on oral antibiotics for a period of time.   Other treatments may be required depending upon the cause of the infection.  HOME CARE  INSTRUCTIONS   Take your antibiotics as directed. Finish them even if you start to feel better.   Make an appointment to have your urine checked to make sure the infection is gone.   Drink enough fluids to keep your urine clear or pale yellow.   Take medicines for the bladder if you have urgency and frequency of urination as directed by your caregiver.  SEEK IMMEDIATE MEDICAL CARE IF:   You have a fever.   You are unable to take your antibiotics or fluids.   You develop shaking chills.   You experience extreme weakness or fainting.   There is no improvement after 2 days of treatment.  MAKE SURE YOU:  Understand these instructions.   Will watch your condition.   Will get help right away if you are not doing well or get worse.  Document Released: 03/16/2005 Document Revised: 03/05/2011 Document Reviewed: 08/20/2010 Exit[employer] Patient Information 2012 [employer]. 

## 2011-07-27 NOTE — ED Notes (Signed)
Pt in c/o worsened symptoms of a dx kidney infection, states she has noted continued pain with urination, incontinence, bilateral flank pain, denies n/v

## 2012-07-01 ENCOUNTER — Inpatient Hospital Stay (HOSPITAL_COMMUNITY): Payer: Self-pay

## 2012-07-01 ENCOUNTER — Inpatient Hospital Stay (HOSPITAL_COMMUNITY)
Admission: EM | Admit: 2012-07-01 | Discharge: 2012-07-03 | DRG: 069 | Disposition: A | Discharge status: Home / Self Care | Payer: MEDICAID | Attending: Family Medicine | Admitting: Family Medicine

## 2012-07-01 ENCOUNTER — Encounter (HOSPITAL_COMMUNITY): Payer: Self-pay | Admitting: Emergency Medicine

## 2012-07-01 ENCOUNTER — Emergency Department (HOSPITAL_COMMUNITY): Payer: Self-pay

## 2012-07-01 DIAGNOSIS — R200 Anesthesia of skin: Secondary | ICD-10-CM

## 2012-07-01 DIAGNOSIS — G459 Transient cerebral ischemic attack, unspecified: Principal | ICD-10-CM

## 2012-07-01 DIAGNOSIS — I639 Cerebral infarction, unspecified: Secondary | ICD-10-CM

## 2012-07-01 DIAGNOSIS — E079 Disorder of thyroid, unspecified: Secondary | ICD-10-CM | POA: Diagnosis present

## 2012-07-01 DIAGNOSIS — R202 Paresthesia of skin: Secondary | ICD-10-CM | POA: Diagnosis present

## 2012-07-01 DIAGNOSIS — E878 Other disorders of electrolyte and fluid balance, not elsewhere classified: Secondary | ICD-10-CM | POA: Diagnosis present

## 2012-07-01 DIAGNOSIS — J302 Other seasonal allergic rhinitis: Secondary | ICD-10-CM

## 2012-07-01 DIAGNOSIS — M129 Arthropathy, unspecified: Secondary | ICD-10-CM | POA: Diagnosis present

## 2012-07-01 DIAGNOSIS — G0490 Encephalitis and encephalomyelitis, unspecified: Secondary | ICD-10-CM | POA: Diagnosis present

## 2012-07-01 DIAGNOSIS — G43909 Migraine, unspecified, not intractable, without status migrainosus: Secondary | ICD-10-CM | POA: Diagnosis present

## 2012-07-01 DIAGNOSIS — Z8249 Family history of ischemic heart disease and other diseases of the circulatory system: Secondary | ICD-10-CM

## 2012-07-01 DIAGNOSIS — F17200 Nicotine dependence, unspecified, uncomplicated: Secondary | ICD-10-CM

## 2012-07-01 DIAGNOSIS — R209 Unspecified disturbances of skin sensation: Secondary | ICD-10-CM

## 2012-07-01 DIAGNOSIS — Z9884 Bariatric surgery status: Secondary | ICD-10-CM

## 2012-07-01 DIAGNOSIS — G589 Mononeuropathy, unspecified: Secondary | ICD-10-CM | POA: Diagnosis present

## 2012-07-01 DIAGNOSIS — J309 Allergic rhinitis, unspecified: Secondary | ICD-10-CM | POA: Diagnosis present

## 2012-07-01 DIAGNOSIS — J45909 Unspecified asthma, uncomplicated: Secondary | ICD-10-CM | POA: Diagnosis present

## 2012-07-01 DIAGNOSIS — Z79899 Other long term (current) drug therapy: Secondary | ICD-10-CM

## 2012-07-01 DIAGNOSIS — Z809 Family history of malignant neoplasm, unspecified: Secondary | ICD-10-CM

## 2012-07-01 HISTORY — DX: Gastro-esophageal reflux disease without esophagitis: K21.9

## 2012-07-01 HISTORY — DX: Fibromyalgia: M79.7

## 2012-07-01 HISTORY — DX: Visual discomfort, unspecified: H53.149

## 2012-07-01 HISTORY — DX: Pure hypercholesterolemia, unspecified: E78.00

## 2012-07-01 HISTORY — DX: Other chronic pain: G89.29

## 2012-07-01 HISTORY — DX: Migraine, unspecified, not intractable, without status migrainosus: G43.909

## 2012-07-01 HISTORY — DX: Low back pain, unspecified: M54.50

## 2012-07-01 HISTORY — DX: Low back pain: M54.5

## 2012-07-01 HISTORY — DX: Depression, unspecified: F32.A

## 2012-07-01 HISTORY — DX: Heart failure, unspecified: I50.9

## 2012-07-01 HISTORY — DX: Other disorders of lung: J98.4

## 2012-07-01 HISTORY — DX: Major depressive disorder, single episode, unspecified: F32.9

## 2012-07-01 HISTORY — DX: Presence of cardiac pacemaker: Z95.0

## 2012-07-01 HISTORY — DX: Shortness of breath: R06.02

## 2012-07-01 HISTORY — DX: Chronic obstructive pulmonary disease, unspecified: J44.9

## 2012-07-01 HISTORY — DX: Essential (primary) hypertension: I10

## 2012-07-01 LAB — POCT I-STAT, CHEM 8
Calcium, Ion: 1.31 mmol/L — ABNORMAL HIGH (ref 1.12–1.23)
Creatinine, Ser: 0.6 mg/dL (ref 0.50–1.10)
Glucose, Bld: 80 mg/dL (ref 70–99)
Hemoglobin: 14.3 g/dL (ref 12.0–15.0)
Sodium: 141 mEq/L (ref 135–145)
TCO2: 26 mmol/L (ref 0–100)

## 2012-07-01 LAB — CBC WITH DIFFERENTIAL/PLATELET
Eosinophils Absolute: 0.5 10*3/uL (ref 0.0–0.7)
Eosinophils Relative: 6 % — ABNORMAL HIGH (ref 0–5)
Lymphs Abs: 3.7 10*3/uL (ref 0.7–4.0)
MCH: 30 pg (ref 26.0–34.0)
MCV: 89.2 fL (ref 78.0–100.0)
Monocytes Absolute: 0.5 10*3/uL (ref 0.1–1.0)
Monocytes Relative: 6 % (ref 3–12)
Platelets: 210 10*3/uL (ref 150–400)
RBC: 4.64 MIL/uL (ref 3.87–5.11)

## 2012-07-01 MED ORDER — ASPIRIN 325 MG PO TABS
325.0000 mg | ORAL_TABLET | Freq: Every day | ORAL | Status: DC
  Administered 2012-07-01 – 2012-07-03 (×3): 325 mg via ORAL
  Filled 2012-07-01 (×4): qty 1

## 2012-07-01 MED ORDER — ONDANSETRON HCL 4 MG/2ML IJ SOLN: 4.0000 mg | Freq: Three times a day (TID) | INTRAMUSCULAR | Status: AC | PRN

## 2012-07-01 MED ORDER — IBUPROFEN 400 MG PO TABS
400.0000 mg | ORAL_TABLET | Freq: Once | ORAL | Status: AC
  Administered 2012-07-01: 400 mg via ORAL
  Filled 2012-07-01 (×2): qty 1

## 2012-07-01 MED ORDER — ASPIRIN 300 MG RE SUPP
300.0000 mg | Freq: Every day | RECTAL | Status: DC
  Filled 2012-07-01 (×3): qty 1

## 2012-07-01 MED ORDER — SODIUM CHLORIDE 0.9 % IV BOLUS (SEPSIS)
1000.0000 mL | Freq: Once | INTRAVENOUS | Status: AC
  Administered 2012-07-01: 1000 mL via INTRAVENOUS

## 2012-07-01 MED ORDER — SENNOSIDES-DOCUSATE SODIUM 8.6-50 MG PO TABS
1.0000 | ORAL_TABLET | Freq: Every evening | ORAL | Status: DC | PRN
  Filled 2012-07-01: qty 1

## 2012-07-01 NOTE — ED Provider Notes (Signed)
History     CSN: [id number]  Arrival date & time 07/01/12  1226   First MD Initiated Contact with Patient 07/01/12 1255      Chief Complaint  Patient presents with  . Numbness    (Consider location/radiation/quality/duration/timing/severity/associated sxs/prior treatment) HPI Comments: Pt with no significant medical hx comes in with cc of numbness. Pt states that on Tuesday evening, she had a sudden collapse while standing. It was not a syncopal episode. She was noted to be confused right after the fall, but thought that she was shaken up from the fall and went home. Pt thereafter started noticing that she was having difficulty concentrating, forming new memory (she was studying for exam), and also having some vision problems (cobwebs on the right side). Today, patient appreciated a little numbness to the right face and more difficulty expressing herself. No hx of strokes.   The history is provided by the patient and medical records.    Past Medical History  Diagnosis Date  . Thyroid disease   . Asthma   . Arthritis     Past Surgical History  Procedure Laterality Date  . Abdominal surgery    . Gastric bypass    . 1/2 of thyroid removed      Tumor was benign  . Bilateral carpal tunnel release    . Abdominal hysterectomy    . 2 c-sections    . Appendectomy      Family History  Problem Relation Age of Onset  . Cancer Mother   . Hyperlipidemia Mother   . Heart failure Mother     History  Substance Use Topics  . Smoking status: Current Every Day Smoker    Types: Cigarettes  . Smokeless tobacco: Never Used  . Alcohol Use: Yes     Comment: occ    OB History   Grav Para Term Preterm Abortions TAB SAB Ect Mult Living                  Review of Systems  Constitutional: Negative for activity change.  HENT: Negative for facial swelling and neck pain.   Eyes: Positive for visual disturbance.  Respiratory: Negative for cough, shortness of breath and wheezing.    Cardiovascular: Negative for chest pain.  Gastrointestinal: Negative for nausea, vomiting, abdominal pain, diarrhea, constipation, blood in stool and abdominal distention.  Genitourinary: Negative for hematuria and difficulty urinating.  Skin: Negative for color change.  Neurological: Positive for dizziness, weakness, light-headedness and numbness. Negative for speech difficulty.  Hematological: Does not bruise/bleed easily.  Psychiatric/Behavioral: Positive for confusion.    Allergies  Review of patient's allergies indicates no known allergies.  Home Medications   Current Outpatient Rx  Name  Route  Sig  Dispense  Refill  . acetaminophen (TYLENOL) 500 MG tablet   Oral   Take 500 mg by mouth every 6 (six) hours as needed. For pain relief         . CALCIUM-VITAMIN D PO   Oral   Take 1 tablet by mouth daily.         . cetirizine (ZYRTEC) 10 MG tablet   Oral   Take 10 mg by mouth daily.           . diphenhydrAMINE (SOMINEX) 25 MG tablet   Oral   Take 25 mg by mouth every morning.         . hydrocortisone cream 1 %   Topical   Apply 1 application topically as needed. For psoriasis.          Marland Kitchen  ibuprofen (ADVIL) 200 MG tablet   Oral   Take 400-800 mg by mouth every 8 (eight) hours as needed for pain. For fever or pain.         . Multiple Vitamins-Minerals (MULTIVITAMINS THER. W/MINERALS) TABS   Oral   Take 1 tablet by mouth daily.             BP 114/63  Pulse 58  Temp(Src) 98 F (36.7 C) (Oral)  Resp 17  SpO2 98%  Physical Exam  Nursing note and vitals reviewed. Constitutional: She is oriented to person, place, and time. She appears well-developed and well-nourished.  HENT:  Head: Normocephalic and atraumatic.  Eyes: EOM are normal. Pupils are equal, round, and reactive to light. No scleral icterus.  Neck: Neck supple.  Cardiovascular: Normal rate, regular rhythm and normal heart sounds.   No murmur heard. Pulmonary/Chest: Effort normal. No  respiratory distress.  Abdominal: Soft. She exhibits no distension. There is no tenderness. There is no rebound and no guarding.  Neurological: She is alert and oriented to person, place, and time.  Right sided numbness over the face, upper extremity and lower extremity. Cerebellar exam and strength exam appear normal.  Skin: Skin is warm and dry.    ED Course  Procedures (including critical care time)  Labs Reviewed  CBC WITH DIFFERENTIAL - Abnormal; Notable for the following:    Eosinophils Relative 6 (*)    All other components within normal limits  POCT I-STAT, CHEM 8 - Abnormal; Notable for the following:    Calcium, Ion 1.31 (*)    All other components within normal limits  GLUCOSE, CAPILLARY   Ct Head Wo Contrast  07/01/2012  *RADIOLOGY REPORT*  Clinical Data: Numbness and tingling of the lips and tongue.  CT HEAD WITHOUT CONTRAST  Technique:  Contiguous axial images were obtained from the base of the skull through the vertex without contrast.  Comparison: CT head without contrast 05/26/2004  Findings: No acute cortical infarct, hemorrhage, mass lesion is present.  The ventricles are of normal size.  No significant extra- axial fluid collection is present.  The fluid levels present in the right maxillary sinus.  Minimal mucosal thickening or fluid is present in the left to right sphenoid sinus.  The paranasal sinuses and mastoid air cells are otherwise clear.  IMPRESSION:  1.  Normal CT appearance of the brain. 2.  Fluid level of the right maxillary sinus compatible with acute sinusitis.   Original Report Authenticated By: Marin Roberts, M.D.      1. Stroke       MDM   Date: 07/01/2012  Rate: 49  Rhythm: sinus bradycardia  QRS Axis: normal  Intervals: normal  ST/T Wave abnormalities: nonspecific ST/T changes  Conduction Disutrbances:none  Narrative Interpretation:   Old EKG Reviewed: none available  DDx includes:  Stroke - ischemic vs.  hemorrhagic TIA Neuropathy Myelitis Electrolyte abnormality Neuropathy Muscular disease  Pt with no medical hx comes in with cc of numbness, aphasia. Subacute stroke clinically. Neuro consulted, Dr. Roseanne Reno.  IM to admit.  Pt is outside of TPA window.     Derwood Kaplan, MD 07/01/12 1537

## 2012-07-01 NOTE — Progress Notes (Signed)
CSW met with the Pt and Pt's daughter at the bedside. Pt's daughter briefly explained that Pt is currently in school at [employer] and does not have insurance through the school program. Pt stated she believes she "had a stroke" and that she will not be able to pay for her stay here at the hospital.   CSW provided Pt with an medicaid application and contact information for the [facility] DSS. CSW also recommended that if the Pt is admitted, to request the financial counselor to come and assist with applying for the medicaid.   Pt and Pt's daughter were appreciative for assistance.   No further CSW needs at this time.   Leron Croak, LCSWA [facility].  [phone]

## 2012-07-01 NOTE — Consult Note (Signed)
Referring Physician: Rhunette Croft    Chief Complaint: HA and right sided weakness  HPI:                                                                                                                                         Darlene Meyer is an 57 y.o. female who went out to her car Tuesday night after studying "very hard for a stressful test", as she was putting the key into her car she fell to the ground.  She denies any LOC and recalls entire event.  She bruised her right knee.  She went back into house and went to sleep.  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  She is complaining of right arm and leg decreased sensation now but states she has decreased sensation in her feet and hand "all the time and did not pay attention to it". Her symptoms currently have been present for >36 hours. Initial CT head is negative for acute bleed or stroke.   Patient also states she has a mild HA--she has history of migraines which she takes tylenol.  Her usual HA is bilateral and accompanies by photophobia. Currently this does not feel like her normal migraine HA.   Date last known well: 4.2.14 Time last known well: 4.2.14 tPA Given: No: out of window.   Past Medical History  Diagnosis Date  . Thyroid disease   . Asthma   . Arthritis     Past Surgical History  Procedure Laterality Date  . Abdominal surgery    . Gastric bypass    . 1/2 of thyroid removed      Tumor was benign  . Bilateral carpal tunnel release    . Abdominal hysterectomy    . 2 c-sections    . Appendectomy      Family History  Problem Relation Age of Onset  . Cancer Mother   . Hyperlipidemia Mother   . Heart failure Mother    Social History:  reports that she has been smoking Cigarettes.  She has been smoking about 0.00 packs per day. She has never used smokeless tobacco. She reports that  drinks  alcohol. She reports that she does not use illicit drugs.  Allergies: No Known Allergies  Medications:  No current facility-administered medications for this encounter.   Current Outpatient Prescriptions  Medication Sig Dispense Refill  . acetaminophen (TYLENOL) 500 MG tablet Take 500 mg by mouth every 6 (six) hours as needed. For pain relief      . CALCIUM-VITAMIN D PO Take 1 tablet by mouth daily.      . cetirizine (ZYRTEC) 10 MG tablet Take 10 mg by mouth daily.        . diphenhydrAMINE (SOMINEX) 25 MG tablet Take 25 mg by mouth every morning.      . hydrocortisone cream 1 % Apply 1 application topically as needed. For psoriasis.       Marland Kitchen ibuprofen (ADVIL) 200 MG tablet Take 400-800 mg by mouth every 8 (eight) hours as needed for pain. For fever or pain.      . Multiple Vitamins-Minerals (MULTIVITAMINS THER. W/MINERALS) TABS Take 1 tablet by mouth daily.           ROS:                                                                                                                                       History obtained from the patient  General ROS: negative for - chills, fatigue, fever, night sweats, weight gain or weight loss Psychological ROS: negative for - behavioral disorder, hallucinations, memory difficulties, mood swings or suicidal ideation Ophthalmic ROS: negative for - blurry vision, double vision, eye pain or loss of vision ENT ROS: negative for - epistaxis, nasal discharge, oral lesions, sore throat, tinnitus or vertigo Allergy and Immunology ROS: negative for - hives or itchy/watery eyes Hematological and Lymphatic ROS: negative for - bleeding problems, bruising or swollen lymph nodes Endocrine ROS: negative for - galactorrhea, hair pattern changes, polydipsia/polyuria or temperature intolerance Respiratory ROS: negative for - cough, hemoptysis,  shortness of breath or wheezing Cardiovascular ROS: negative for - chest pain, dyspnea on exertion, edema or irregular heartbeat Gastrointestinal ROS: negative for - abdominal pain, diarrhea, hematemesis, nausea/vomiting or stool incontinence Genito-Urinary ROS: negative for - dysuria, hematuria, incontinence or urinary frequency/urgency Musculoskeletal ROS: negative for - joint swelling or muscular weakness Neurological ROS: as noted in HPI Dermatological ROS: negative for rash and skin lesion changes  Neurologic Examination:                                                                                                      Blood pressure 110/46, pulse 63, temperature 98 F (36.7 C), temperature source Oral, resp. rate 17,  SpO2 99.00%.  Mental Status: Alert, oriented, thought content appropriate.  Speech fluent without evidence of aphasia.  Able to follow 3 step commands without difficulty. Cranial Nerves: II: Discs flat bilaterally; Visual fields grossly normal, pupils equal, round, reactive to light and accommodation III,IV, VI: ptosis not present, extra-ocular motions intact bilaterally V,VII: smile symmetric, facial light touch sensation normal bilaterally VIII: hearing normal bilaterally IX,X: gag reflex present XI: bilateral shoulder shrug XII: midline tongue extension Motor: Right : Upper extremity   4/5 (note)(pain)    Left:     Upper extremity   5/5  Lower extremity   4/5 (note)(pain)    Lower extremity   5/5 --Patients exam on the right is very effort dependent and shows give way strength and limited by patients sensation of pain.  --slight orbiting noted left over right but when she goes backward there is no orbiting noted.  --no drift UE, lets right leg drop to bed but has 4/5 strength when formally tested.  Tone and bulk:normal tone throughout; no atrophy noted Sensory: Pinprick and light touch intact throughout, bilaterally--she feels decreased sensation on right from  forehead to foot, splits midline to tuning fork in forehead, nose, chin and sternum Deep Tendon Reflexes: 2+ and symmetric throughout Plantars: Right: downgoing   Left: downgoing Cerebellar: normal finger-to-nose,  normal heel-to-shin test CV: pulses palpable throughout    Results for orders placed during the hospital encounter of 07/01/12 (from the past 48 hour(s))  GLUCOSE, CAPILLARY     Status: None   Collection Time    07/01/12  1:02 PM      Result Value Range   Glucose-Capillary 83  70 - 99 mg/dL  CBC WITH DIFFERENTIAL     Status: Abnormal   Collection Time    07/01/12  1:35 PM      Result Value Range   WBC 9.1  4.0 - 10.5 K/uL   RBC 4.64  3.87 - 5.11 MIL/uL   Hemoglobin 13.9  12.0 - 15.0 g/dL   HCT 16.1  09.6 - 04.5 %   MCV 89.2  78.0 - 100.0 fL   MCH 30.0  26.0 - 34.0 pg   MCHC 33.6  30.0 - 36.0 g/dL   RDW 40.9  81.1 - 91.4 %   Platelets 210  150 - 400 K/uL   Neutrophils Relative 48  43 - 77 %   Neutro Abs 4.4  1.7 - 7.7 K/uL   Lymphocytes Relative 40  12 - 46 %   Lymphs Abs 3.7  0.7 - 4.0 K/uL   Monocytes Relative 6  3 - 12 %   Monocytes Absolute 0.5  0.1 - 1.0 K/uL   Eosinophils Relative 6 (*) 0 - 5 %   Eosinophils Absolute 0.5  0.0 - 0.7 K/uL   Basophils Relative 0  0 - 1 %   Basophils Absolute 0.0  0.0 - 0.1 K/uL  POCT I-STAT, CHEM 8     Status: Abnormal   Collection Time    07/01/12  1:56 PM      Result Value Range   Sodium 141  135 - 145 mEq/L   Potassium 4.2  3.5 - 5.1 mEq/L   Chloride 107  96 - 112 mEq/L   BUN 13  6 - 23 mg/dL   Creatinine, Ser 7.82  0.50 - 1.10 mg/dL   Glucose, Bld 80  70 - 99 mg/dL   Calcium, Ion 9.56 (*) 1.12 - 1.23 mmol/L   TCO2 26  0 -  100 mmol/L   Hemoglobin 14.3  12.0 - 15.0 g/dL   HCT 16.1  09.6 - 04.5 %   Ct Head Wo Contrast  07/01/2012  *RADIOLOGY REPORT*  Clinical Data: Numbness and tingling of the lips and tongue.  CT HEAD WITHOUT CONTRAST  Technique:  Contiguous axial images were obtained from the base of the skull  through the vertex without contrast.  Comparison: CT head without contrast 05/26/2004  Findings: No acute cortical infarct, hemorrhage, mass lesion is present.  The ventricles are of normal size.  No significant extra- axial fluid collection is present.  The fluid levels present in the right maxillary sinus.  Minimal mucosal thickening or fluid is present in the left to right sphenoid sinus.  The paranasal sinuses and mastoid air cells are otherwise clear.  IMPRESSION:  1.  Normal CT appearance of the brain. 2.  Fluid level of the right maxillary sinus compatible with acute sinusitis.   Original Report Authenticated By: Marin Roberts, M.D.     Assessment and plan discussed with with attending physician and they are in agreement.    Felicie Morn PA-C [facility] [phone]  07/01/2012, 3:51 PM   Assessment: 57 y.o. female with right arm and leg decreased sensation, and difficulty expressing herself for >36 hours.  Patient currently is having a 3/10 HA (present since Tuesday night) which she feels is not typical of her migraine HA.  At present time her blood pressure is well controled 130/70 and patient is slightly emotional. Likely complicated migraine, however cannot exclude CVA. Patient is being admitted to hospital by hospitalist for further investigation.    Stroke Risk Factors - smoking  Plan: 1. HgbA1c, fasting lipid panel 2. MRI, MRA  of the brain without contrast 3. PT consult, OT consult, Speech consult 4. Echocardiogram 5. Carotid dopplers 6. Prophylactic therapy-Antiplatelet med: Aspirin - dose 325 mg daily 7. Risk factor modification 8. Telemetry monitoring 9. Frequent neuro checks 10. Treat underlying HA which is currently a 2-3/10 and mild per patient.    I personally participate in this patient's evaluation and management including the clinical assessment and management recommendations.  Venetia Maxon M.D. [facility] [phone]

## 2012-07-01 NOTE — ED Notes (Signed)
Pt c/o head feeling like "mush". Pt denies pain. Pt sts she fell on Tuesday night, denies hitting head/LOC. Pt sts on Wednesday she was studying for her test and wasn't able to retain the information like normal. Pt reports she feels like her vision is normal, like she is looking through a net. Pt sts she feels like she is having cognitive issues. Pt denies dizziness, reports she feels foggy and c/o tongue and lip numbness started when she woke up on Wednesday morning.

## 2012-07-01 NOTE — ED Notes (Signed)
Neuro PA at bedside requesting to finished exam before pt is transferred.

## 2012-07-01 NOTE — ED Notes (Signed)
MD notified pt didn't pass swallow screen.

## 2012-07-01 NOTE — H&P (Signed)
[facility] History and Physical  Darlene Meyer BJY:[id number] DOB: February 22, 1956 DOA: 07/01/2012  Referring physician: Dr. Rhunette Croft PCP: Provider Not In System  Specialists: Neurology consulted by ED physician  Chief Complaint: Right sided numbness in face and extremities.  HPI: Darlene Meyer is a 57 y.o. female  With PMH as listed below and current smoking history who presented to the ED complaining of right sided numbness as well as difficulty obtaining the right words since Wednesday 06/29/12.  Since the problem has persisted patient decided to come to the ED for further evaluation.  Nothing the patient is aware of makes the problem better or worse.  She also endorses blurred vision and headache. The problem came on insidiously.    While in the Ed patient had ct of head which was unremarkable but given stroke like symptoms we were consulted for admission evaluation and recommendations.  Per my discussion with ED physician Neurology was also consulted.  Review of Systems: 10 point review of system reviewed and negative unless otherwise mentioned above.  Past Medical History  Diagnosis Date  . Thyroid disease   . Asthma   . Arthritis    Past Surgical History  Procedure Laterality Date  . Abdominal surgery    . Gastric bypass    . 1/2 of thyroid removed      Tumor was benign  . Bilateral carpal tunnel release    . Abdominal hysterectomy    . 2 c-sections    . Appendectomy     Social History:  reports that she has been smoking Cigarettes.  She has been smoking about 0.00 packs per day. She has never used smokeless tobacco. She reports that  drinks alcohol. She reports that she does not use illicit drugs.  where does patient live--home, ALF, SNF? and with whom if at home? Lives at home  Can patient participate in ADLs? yes  No Known Allergies  Family History  Problem Relation Age of Onset  . Cancer Mother   . Hyperlipidemia Mother   . Heart failure Mother   none other  reported  Prior to Admission medications   Medication Sig Start Date End Date Taking? Authorizing Provider  acetaminophen (TYLENOL) 500 MG tablet Take 500 mg by mouth every 6 (six) hours as needed. For pain relief   Yes Historical Provider, MD  CALCIUM-VITAMIN D PO Take 1 tablet by mouth daily.   Yes Historical Provider, MD  cetirizine (ZYRTEC) 10 MG tablet Take 10 mg by mouth daily.     Yes Historical Provider, MD  diphenhydrAMINE (SOMINEX) 25 MG tablet Take 25 mg by mouth every morning.   Yes Historical Provider, MD  hydrocortisone cream 1 % Apply 1 application topically as needed. For psoriasis.    Yes Historical Provider, MD  ibuprofen (ADVIL) 200 MG tablet Take 400-800 mg by mouth every 8 (eight) hours as needed for pain. For fever or pain.   Yes Historical Provider, MD  Multiple Vitamins-Minerals (MULTIVITAMINS THER. W/MINERALS) TABS Take 1 tablet by mouth daily.     Yes Historical Provider, MD   Physical Exam: Filed Vitals:   07/01/12 1220 07/01/12 1222 07/01/12 1245 07/01/12 1445  BP:  104/68 114/63   Pulse:  58 58   Temp:  97.5 F (36.4 C)  98 F (36.7 C)  TempSrc:  Oral    Resp:  20 17   SpO2: 96% 98% 98%      General:  Pt in NAD, Alert and Awake  Eyes: EOMI, non icteric  ENT: normal exterior appearance, moist mucous membranes  Neck: supple, no goiter  Cardiovascular: RRR, No MRG  Respiratory: CTA BL, no wheezes  Abdomen: soft, NT, ND  Skin: warm and dry  Musculoskeletal: no cyanosis or clubbing  Psychiatric: mood and affect appropriate  Neurologic: right sided weakness when compared to the left, sensation to light touch diminished at right side when compared to the left.   Labs on Admission:  Basic Metabolic Panel:  Recent Labs Lab 07/01/12 1356  NA 141  K 4.2  CL 107  GLUCOSE 80  BUN 13  CREATININE 0.60   Liver Function Tests: No results found for this basename: AST, ALT, ALKPHOS, BILITOT, PROT, ALBUMIN,  in the last 168 hours No results  found for this basename: LIPASE, AMYLASE,  in the last 168 hours No results found for this basename: AMMONIA,  in the last 168 hours CBC:  Recent Labs Lab 07/01/12 1335 07/01/12 1356  WBC 9.1  --   NEUTROABS 4.4  --   HGB 13.9 14.3  HCT 41.4 42.0  MCV 89.2  --   PLT 210  --    Cardiac Enzymes: No results found for this basename: CKTOTAL, CKMB, CKMBINDEX, TROPONINI,  in the last 168 hours  BNP (last 3 results) No results found for this basename: PROBNP,  in the last 8760 hours CBG:  Recent Labs Lab 07/01/12 1302  GLUCAP 83    Radiological Exams on Admission: Ct Head Wo Contrast  07/01/2012  *RADIOLOGY REPORT*  Clinical Data: Numbness and tingling of the lips and tongue.  CT HEAD WITHOUT CONTRAST  Technique:  Contiguous axial images were obtained from the base of the skull through the vertex without contrast.  Comparison: CT head without contrast 05/26/2004  Findings: No acute cortical infarct, hemorrhage, mass lesion is present.  The ventricles are of normal size.  No significant extra- axial fluid collection is present.  The fluid levels present in the right maxillary sinus.  Minimal mucosal thickening or fluid is present in the left to right sphenoid sinus.  The paranasal sinuses and mastoid air cells are otherwise clear.  IMPRESSION:  1.  Normal CT appearance of the brain. 2.  Fluid level of the right maxillary sinus compatible with acute sinusitis.   Original Report Authenticated By: Marin Roberts, M.D.     EKG: Independently reviewed. Sinus Rhythm with no ST elevation or depression  Assessment/Plan Active Problems:   Numbness and tingling of right arm and leg   Numbness and tingling of right face   Nicotine dependence   1. Numbness in right side of body (arms, legs, face) - Given stroke like symptoms will place routine stroke work up - place on aspirin 325 mg daily - MRI/MRA of head - carotid doppler - will not place order for echocardiogram and will defer to  neurology.  - Pt/Ot and speech therapy evaluation - fasting lipid profile.  Plan on adding statin pending results unless otherwise recommended by neurology.  2. Nicotine dependence - will place on nicotine patch - recommend cessation.   Code Status:  Full code. Family Communication: spoke with pt and daughter at bedside. Disposition Plan: Patient does not have insurance and is worried about bill.  Will consult social worker.   Time spent: > 60 minutes  Penny Pia [facility] Pager [phone]  If 7PM-7AM, please contact night-coverage [email or web address] Password [facility] 07/01/2012, 2:58 PM

## 2012-07-01 NOTE — ED Notes (Signed)
Speech therapy came to evaluate pt - they report she has passed.

## 2012-07-01 NOTE — Evaluation (Signed)
Clinical/Bedside Swallow Evaluation Patient Details  Name: Hayzel Ruberg MRN: [id number] Date of Birth: Aug 31, 1955  Today's Date: 07/01/2012 Time: 6213-0865 SLP Time Calculation (min): 13 min  Past Medical History:  Past Medical History  Diagnosis Date  . Thyroid disease   . Asthma   . Arthritis    Past Surgical History:  Past Surgical History  Procedure Laterality Date  . Abdominal surgery    . Gastric bypass    . 1/2 of thyroid removed      Tumor was benign  . Bilateral carpal tunnel release    . Abdominal hysterectomy    . 2 c-sections    . Appendectomy     HPI:  Pt presented to the ED complaining of right sided numbness as well as difficulty obtaining the right words since Wednesday 06/29/12.  Since the problem has persisted patient decided to come to the ED for further evaluation. Pt failed Rn stroke swallow screen in the ED, SLP was called to ED for swallow eval. Pt had Upper GI s/p gastric bypass in 2005. No abnormal finding.    Assessment / Plan / Recommendation Clinical Impression  Pt demosntrates normal oropharyngeal function. Pt reports facial tingling, but function not impacted. Pt anxious. Recommend regular diet and thin liquids. No SLP f/u needed for swallowing. Pt did exhibit some word finding difficulty, SLP order for cognitive linguistic function pending.     Aspiration Risk  Mild    Diet Recommendation Regular;Thin liquid   Liquid Administration via: Cup;Straw Medication Administration: Whole meds with liquid Supervision: Patient able to self feed Postural Changes and/or Swallow Maneuvers: Seated upright 90 degrees    Other  Recommendations     Follow Up Recommendations  None    Frequency and Duration        Pertinent Vitals/Pain NA    SLP Swallow Goals     Swallow Study Prior Functional Status       General HPI: Pt presented to the ED complaining of right sided numbness as well as difficulty obtaining the right words since Wednesday 06/29/12.   Since the problem has persisted patient decided to come to the ED for further evaluation. Pt failed Rn stroke swallow screen in the ED, SLP was called to ED for swallow eval. Pt had Upper GI s/p gastric bypass in 2005. No abnormal finding.  Type of Study: Bedside swallow evaluation Previous Swallow Assessment: none Diet Prior to this Study: NPO Temperature Spikes Noted: No Respiratory Status: Room air History of Recent Intubation: No Behavior/Cognition: Alert;Cooperative;Pleasant mood Oral Cavity - Dentition: Dentures, not available Self-Feeding Abilities: Able to feed self Patient Positioning: Upright in bed Baseline Vocal Quality: Clear Volitional Cough: Strong Volitional Swallow: Able to elicit    Oral/Motor/Sensory Function Overall Oral Motor/Sensory Function: Impaired Labial ROM: Within Functional Limits Labial Symmetry: Within Functional Limits Labial Strength: Within Functional Limits Labial Sensation: Reduced Lingual ROM: Within Functional Limits Lingual Symmetry: Within Functional Limits Lingual Strength: Within Functional Limits Lingual Sensation: Reduced Facial ROM: Within Functional Limits Facial Symmetry: Within Functional Limits Facial Strength: Within Functional Limits Facial Sensation: Reduced Velum: Within Functional Limits Mandible: Within Functional Limits   Ice Chips Ice chips: Within functional limits   Thin Liquid Thin Liquid: Within functional limits    Nectar Thick Nectar Thick Liquid: Not tested   Honey Thick Honey Thick Liquid: Not tested   Puree Puree: Within functional limits   Solid   GO    Solid: Within functional limits      Harlon Ditty, MA  CCC-SLP [phone]  Claudine Mouton 07/01/2012,3:46 PM

## 2012-07-01 NOTE — ED Notes (Addendum)
Pt reports to the ED via [facility] for eval of numbness and tingling of her lips and tongue x 2 days. Pt reports she woke up Wednesday morning with these symptoms. Other than some slight difficulty with word retrieval no neuro deficits noted per EMS. Pt A&O x4 per EMS. V/S stable en route. Pt denies any pain or associated symptoms.

## 2012-07-02 DIAGNOSIS — I6350 Cerebral infarction due to unspecified occlusion or stenosis of unspecified cerebral artery: Secondary | ICD-10-CM

## 2012-07-02 DIAGNOSIS — J302 Other seasonal allergic rhinitis: Secondary | ICD-10-CM | POA: Diagnosis present

## 2012-07-02 DIAGNOSIS — J309 Allergic rhinitis, unspecified: Secondary | ICD-10-CM

## 2012-07-02 DIAGNOSIS — G459 Transient cerebral ischemic attack, unspecified: Principal | ICD-10-CM

## 2012-07-02 LAB — HEMOGLOBIN A1C: Hgb A1c MFr Bld: 5.2 % (ref <5.7)

## 2012-07-02 LAB — LIPID PANEL
LDL Cholesterol: 109 mg/dL — ABNORMAL HIGH (ref 0–99)
VLDL: 15 mg/dL (ref 0–40)

## 2012-07-02 MED ORDER — SIMVASTATIN 20 MG PO TABS
20.0000 mg | ORAL_TABLET | Freq: Every day | ORAL | Status: DC
  Administered 2012-07-02: 20 mg via ORAL
  Filled 2012-07-02 (×2): qty 1

## 2012-07-02 MED ORDER — IBUPROFEN 400 MG PO TABS
400.0000 mg | ORAL_TABLET | Freq: Four times a day (QID) | ORAL | Status: DC | PRN
  Administered 2012-07-02 (×2): 400 mg via ORAL
  Filled 2012-07-02 (×2): qty 1

## 2012-07-02 MED ORDER — LORATADINE 10 MG PO TABS
10.0000 mg | ORAL_TABLET | Freq: Every day | ORAL | Status: DC
  Administered 2012-07-02 – 2012-07-03 (×2): 10 mg via ORAL
  Filled 2012-07-02 (×2): qty 1

## 2012-07-02 MED ORDER — STROKE: EARLY STAGES OF RECOVERY BOOK
Freq: Once | Status: AC
  Administered 2012-07-02: 13:00:00
  Filled 2012-07-02: qty 1

## 2012-07-02 MED ORDER — NICOTINE 14 MG/24HR TD PT24
14.0000 mg | MEDICATED_PATCH | Freq: Every day | TRANSDERMAL | Status: DC
  Administered 2012-07-02 – 2012-07-03 (×2): 14 mg via TRANSDERMAL
  Filled 2012-07-02 (×2): qty 1

## 2012-07-02 NOTE — Progress Notes (Signed)
VASCULAR LAB PRELIMINARY  PRELIMINARY  PRELIMINARY  PRELIMINARY  Carotid Dopplers completed.    Preliminary report:  There is no ICA stenosis.  Vertebral artery flow is antegrade.  Darlene Meyer, RVT 07/02/2012, 4:14 PM

## 2012-07-02 NOTE — Progress Notes (Signed)
Pt's hr drops down into the 30's -50's while sleeping. Pt is easily woken. Pt denies any cp or sob. Np on call made aware. Will cont to monitor pt.

## 2012-07-02 NOTE — Progress Notes (Addendum)
Subjective: No new complaints. Visual and sensory changes as improved since yesterday. Speech has improved to a lesser extent.  Objective: Current vital signs: BP 115/53  Pulse 66  Temp(Src) 97.6 F (36.4 C) (Oral)  Resp 20  Ht 5\' 4"  (1.626 m)  Wt 86.6 kg (190 lb 14.7 oz)  BMI 32.75 kg/m2  SpO2 98%  Neurologic Exam: Alert and in no acute distress. Patient is well-oriented to time as well as place.  Speech pattern is one of intermittent hesitancy which seems to be worse when talking to several people rather than one person at the time. There is no receptive component to speech abnormality. No dysarthria. No facial weakness. Moves extremities equally and symmetrically.  Lab Results: Results for orders placed during the hospital encounter of 07/01/12 (from the past 48 hour(s))  GLUCOSE, CAPILLARY     Status: None   Collection Time    07/01/12  1:02 PM      Result Value Range   Glucose-Capillary 83  70 - 99 mg/dL  CBC WITH DIFFERENTIAL     Status: Abnormal   Collection Time    07/01/12  1:35 PM      Result Value Range   WBC 9.1  4.0 - 10.5 K/uL   RBC 4.64  3.87 - 5.11 MIL/uL   Hemoglobin 13.9  12.0 - 15.0 g/dL   HCT 16.1  09.6 - 04.5 %   MCV 89.2  78.0 - 100.0 fL   MCH 30.0  26.0 - 34.0 pg   MCHC 33.6  30.0 - 36.0 g/dL   RDW 40.9  81.1 - 91.4 %   Platelets 210  150 - 400 K/uL   Neutrophils Relative 48  43 - 77 %   Neutro Abs 4.4  1.7 - 7.7 K/uL   Lymphocytes Relative 40  12 - 46 %   Lymphs Abs 3.7  0.7 - 4.0 K/uL   Monocytes Relative 6  3 - 12 %   Monocytes Absolute 0.5  0.1 - 1.0 K/uL   Eosinophils Relative 6 (*) 0 - 5 %   Eosinophils Absolute 0.5  0.0 - 0.7 K/uL   Basophils Relative 0  0 - 1 %   Basophils Absolute 0.0  0.0 - 0.1 K/uL  POCT I-STAT, CHEM 8     Status: Abnormal   Collection Time    07/01/12  1:56 PM      Result Value Range   Sodium 141  135 - 145 mEq/L   Potassium 4.2  3.5 - 5.1 mEq/L   Chloride 107  96 - 112 mEq/L   BUN 13  6 - 23 mg/dL   Creatinine, Ser 7.82  0.50 - 1.10 mg/dL   Glucose, Bld 80  70 - 99 mg/dL   Calcium, Ion 9.56 (*) 1.12 - 1.23 mmol/L   TCO2 26  0 - 100 mmol/L   Hemoglobin 14.3  12.0 - 15.0 g/dL   HCT 21.3  08.6 - 57.8 %  HEMOGLOBIN A1C     Status: None   Collection Time    07/02/12  5:35 AM      Result Value Range   Hemoglobin A1C 5.2  <5.7 %   Comment: (NOTE)  According to the ADA Clinical Practice Recommendations for 2011, when     HbA1c is used as a screening test:      >=6.5%   Diagnostic of Diabetes Mellitus               (if abnormal result is confirmed)     5.7-6.4%   Increased risk of developing Diabetes Mellitus     References:Diagnosis and Classification of Diabetes Mellitus,Diabetes     Care,2011,34(Suppl 1):S62-S69 and Standards of Medical Care in             Diabetes - 2011,Diabetes Care,2011,34 (Suppl 1):S11-S61.   Mean Plasma Glucose 103  <117 mg/dL  LIPID PANEL     Status: Abnormal   Collection Time    07/02/12  5:35 AM      Result Value Range   Cholesterol 177  0 - 200 mg/dL   Triglycerides 74  <161 mg/dL   HDL 53  >09 mg/dL   Total CHOL/HDL Ratio 3.3     VLDL 15  0 - 40 mg/dL   LDL Cholesterol 604 (*) 0 - 99 mg/dL   Comment:            Total Cholesterol/HDL:CHD Risk     Coronary Heart Disease Risk Table                         Men   Women      1/2 Average Risk   3.4   3.3      Average Risk       5.0   4.4      2 X Average Risk   9.6   7.1      3 X Average Risk  23.4   11.0                Use the calculated Patient Ratio     above and the CHD Risk Table     to determine the patient's CHD Risk.                ATP III CLASSIFICATION (LDL):      <100     mg/dL   Optimal      540-981  mg/dL   Near or Above                        Optimal      130-159  mg/dL   Borderline      191-478  mg/dL   High      >295     mg/dL   Very High    Studies/Results: Dg Chest 2 View  07/01/2012  *RADIOLOGY  REPORT*  Clinical Data: Blurred vision and slurred speech.  CHEST - 2 VIEW  Comparison: 06/21/2011  Findings: No pulmonary infiltrates, edema or pleural fluid identified.  Heart size is within normal limits.  There is a stable thoracic scoliosis.  IMPRESSION: No active disease.   Original Report Authenticated By: Irish Lack, M.D.    Ct Head Wo Contrast  07/01/2012  *RADIOLOGY REPORT*  Clinical Data: Numbness and tingling of the lips and tongue.  CT HEAD WITHOUT CONTRAST  Technique:  Contiguous axial images were obtained from the base of the skull through the vertex without contrast.  Comparison: CT head without contrast 05/26/2004  Findings: No acute cortical infarct, hemorrhage, mass lesion is present.  The ventricles are of normal size.  No significant extra- axial fluid collection  is present.  The fluid levels present in the right maxillary sinus.  Minimal mucosal thickening or fluid is present in the left to right sphenoid sinus.  The paranasal sinuses and mastoid air cells are otherwise clear.  IMPRESSION:  1.  Normal CT appearance of the brain. 2.  Fluid level of the right maxillary sinus compatible with acute sinusitis.   Original Report Authenticated By: Marin Roberts, M.D.    Mr Brain Wo Contrast  07/01/2012  *RADIOLOGY REPORT*  Clinical Data:   Right-sided numbness with difficulty obtaining correct words since 06/29/2012.  Blurred vision and headaches.  MRI BRAIN WITHOUT CONTRAST MRA HEAD WITHOUT CONTRAST  Technique: Multiplanar, multiecho pulse sequences of the brain and surrounding structures were obtained according to standard protocol without intravenous contrast.  Angiographic images of the head were obtained using MRA technique without contrast.  Comparison: 07/01/2012 CT.  No comparison MR.  MRI HEAD  Findings:  No acute infarct.  No intracranial hemorrhage.  Low-lying cerebellar tonsils 6.9 mm below the foramen magnum without a pointed appearance.  Heterogeneous appearance of bone  marrow of the clivus possibly normal for this patient.  If there are any symptoms referable to this region this can be evaluated follow-up.  No hydrocephalus.  Empty sella.  This may be an incidental finding although also described in patients with pseudotumor cerebri.  No other findings to suggest this diagnosis.  Major intracranial vascular structures are patent.  Right maxillary sinus air fluid level.  Minimal mucosal thickening remainder of paranasal sinuses.  IMPRESSION: No acute infarct.  Low-lying cerebellar tonsils 6.9 mm below the foramen magnum without a pointed appearance.  Heterogeneous appearance of bone marrow of the clivus possibly normal for this patient.  Empty sella.  Right maxillary sinus air fluid level.  MRA HEAD  Findings: Mild motion degradation.  Anterior circulation without medium or large size vessel significant stenosis or occlusion.  Bulge at the level of the origin of the right posterior communicating artery measures up to 2.9 mm. This is at the upper limits of size acceptable for prominent infundibulum, bordering size of a small aneurysm (3 mm).  Recommend confirming stability on follow-up exam in 6 months.  No significant stenosis of the vertebral arteries and basilar artery.  Slightly patulous appearance of the basilar tip without discrete saccular aneurysm. Stability of this appearance can also be confirmed on the follow-up examination.  Nonvisualization AICAs.  Irregularity superior cerebellar arteries bilaterally.  Posterior cerebral artery, PICA and middle cerebral artery mild branch vessel irregularity bilaterally.  IMPRESSION: No medium or large size vessel significant stenosis or occlusion.  Mild branch vessel irregularity as noted above.  Bulge at the origin of the right posterior communicating artery may represent an infundibulum however, as this borders the dimensions usually utilized for an aneurysm, stability will need to be confirmed with follow-up examination in 6  months.  At such time, attention to slightly patulous appearance of basilar tip also recommended.   Original Report Authenticated By: Lacy Duverney, M.D.    Mr Mra Head/brain Wo Cm  07/01/2012  *RADIOLOGY REPORT*  Clinical Data:   Right-sided numbness with difficulty obtaining correct words since 06/29/2012.  Blurred vision and headaches.  MRI BRAIN WITHOUT CONTRAST MRA HEAD WITHOUT CONTRAST  Technique: Multiplanar, multiecho pulse sequences of the brain and surrounding structures were obtained according to standard protocol without intravenous contrast.  Angiographic images of the head were obtained using MRA technique without contrast.  Comparison: 07/01/2012 CT.  No comparison MR.  MRI HEAD  Findings:  No acute infarct.  No intracranial hemorrhage.  Low-lying cerebellar tonsils 6.9 mm below the foramen magnum without a pointed appearance.  Heterogeneous appearance of bone marrow of the clivus possibly normal for this patient.  If there are any symptoms referable to this region this can be evaluated follow-up.  No hydrocephalus.  Empty sella.  This may be an incidental finding although also described in patients with pseudotumor cerebri.  No other findings to suggest this diagnosis.  Major intracranial vascular structures are patent.  Right maxillary sinus air fluid level.  Minimal mucosal thickening remainder of paranasal sinuses.  IMPRESSION: No acute infarct.  Low-lying cerebellar tonsils 6.9 mm below the foramen magnum without a pointed appearance.  Heterogeneous appearance of bone marrow of the clivus possibly normal for this patient.  Empty sella.  Right maxillary sinus air fluid level.  MRA HEAD  Findings: Mild motion degradation.  Anterior circulation without medium or large size vessel significant stenosis or occlusion.  Bulge at the level of the origin of the right posterior communicating artery measures up to 2.9 mm. This is at the upper limits of size acceptable for prominent infundibulum, bordering  size of a small aneurysm (3 mm).  Recommend confirming stability on follow-up exam in 6 months.  No significant stenosis of the vertebral arteries and basilar artery.  Slightly patulous appearance of the basilar tip without discrete saccular aneurysm. Stability of this appearance can also be confirmed on the follow-up examination.  Nonvisualization AICAs.  Irregularity superior cerebellar arteries bilaterally.  Posterior cerebral artery, PICA and middle cerebral artery mild branch vessel irregularity bilaterally.  IMPRESSION: No medium or large size vessel significant stenosis or occlusion.  Mild branch vessel irregularity as noted above.  Bulge at the origin of the right posterior communicating artery may represent an infundibulum however, as this borders the dimensions usually utilized for an aneurysm, stability will need to be confirmed with follow-up examination in 6 months.  At such time, attention to slightly patulous appearance of basilar tip also recommended.   Original Report Authenticated By: Lacy Duverney, M.D.     Medications:  I have reviewed the patient's current medications. Scheduled: . aspirin  300 mg Rectal Daily   Or  . aspirin  325 mg Oral Daily  . loratadine  10 mg Oral Daily  . nicotine  14 mg Transdermal Daily  . simvastatin  20 mg Oral q1800   WUJ:[id number], senna-docusate  Assessment/Plan: Possible TIA with improvement in symptomatology over the past 24 hours since admission. MRI of the brain showed no signs of acute stroke. Carotid Doppler study preliminary report indicated no hemodynamically significant carotid or vertebral artery lesion. Hemoglobin A1c was normal. Fasting lipid panel showed a slight elevation in LDL (109). Echocardiogram is pending.  No changes in current management recommended, including continued antiplatelet therapy with aspirin daily. If echocardiogram is unremarkable, no further neurological intervention is indicated.  C.R. Roseanne Reno, MD [facility] [phone]  07/02/2012  6:13 PM

## 2012-07-02 NOTE — Evaluation (Signed)
Physical Therapy Evaluation Patient Details Name: Darlene Meyer MRN: [id number] DOB: [birth year] Today's Date: 07/02/2012 Time: 1478-2956 PT Time Calculation (min): 28 min  PT Assessment / Plan / Recommendation Clinical Impression  pt presents with r/o CVA.  pt stating multiple times to this PT about how overwhelmed she feels and that she doesn't think she had a stroke.  pt stating she has been stressed from studying and helping her family at home.  At this point pt complains more about the pain in her R thigh from the fall than any other deficits.  Will continue to follow to ensure Independence.      PT Assessment  Patient needs continued PT services    Follow Up Recommendations  No PT follow up    Does the patient have the potential to tolerate intense rehabilitation      Barriers to Discharge None      Equipment Recommendations  None recommended by PT    Recommendations for Other Services     Frequency Min 3X/week    Precautions / Restrictions Precautions Precautions: Fall Restrictions Weight Bearing Restrictions: No   Pertinent Vitals/Pain Did not rate, but indicates pain in R thigh with mobility.  Per RN premedicated prior to PT.        Mobility  Bed Mobility Bed Mobility: Supine to Sit;Sitting - Scoot to Edge of Bed;Sit to Supine Supine to Sit: 6: Modified independent (Device/Increase time);HOB flat Sitting - Scoot to Edge of Bed: 6: Modified independent (Device/Increase time) Sit to Supine: 6: Modified independent (Device/Increase time) Details for Bed Mobility Assistance: pt moves slowly and indicates pain in R thigh, but able to complete ModI.   Transfers Transfers: Sit to Stand;Stand to Sit Sit to Stand: 6: Modified independent (Device/Increase time);With upper extremity assist;From bed;From toilet Stand to Sit: 6: Modified independent (Device/Increase time);With upper extremity assist;To toilet;To bed Ambulation/Gait Ambulation/Gait Assistance: 5:  Supervision Ambulation Distance (Feet): 150 Feet Assistive device: None Ambulation/Gait Assistance Details: pt moves slowly and antalgic on R LE.  pt stops x2 during ambulation and at one point c/o R lateral thigh pain and the second time pt seemed tearful stating she hopes the doctors will just tell her she can stay in bed for a few days and rest, and that she is just over-stressed.   Gait Pattern: Step-through pattern;Decreased stride length;Antalgic Stairs: No Wheelchair Mobility Wheelchair Mobility: No Modified Rankin (Stroke Patients Only) Pre-Morbid Rankin Score: No symptoms Modified Rankin: Slight disability    Exercises     PT Diagnosis: Difficulty walking;Acute pain  PT Problem List: Decreased strength;Decreased activity tolerance;Decreased balance;Decreased mobility;Decreased knowledge of use of DME;Pain PT Treatment Interventions: DME instruction;Gait training;Stair training;Functional mobility training;Therapeutic activities;Therapeutic exercise;Balance training;Patient/family education   PT Goals Acute Rehab PT Goals PT Goal Formulation: With patient Time For Goal Achievement: 07/16/12 Potential to Achieve Goals: Good Pt will go Supine/Side to Sit: Independently PT Goal: Supine/Side to Sit - Progress: Goal set today Pt will go Sit to Supine/Side: Independently PT Goal: Sit to Supine/Side - Progress: Goal set today Pt will go Sit to Stand: with modified independence PT Goal: Sit to Stand - Progress: Goal set today Pt will go Stand to Sit: with modified independence PT Goal: Stand to Sit - Progress: Goal set today Pt will Ambulate: >150 feet;with modified independence PT Goal: Ambulate - Progress: Goal set today Pt will Go Up / Down Stairs: 1-2 stairs;with modified independence PT Goal: Up/Down Stairs - Progress: Goal set today  Visit Information  Last PT Received On:  07/02/12 Assistance Needed: +1    Subjective Data  Subjective: I'm just so stressed with  everything.   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      Extremity/Trunk Assessment Right Lower Extremity Assessment RLE ROM/Strength/Tone: Deficits;Due to pain RLE ROM/Strength/Tone Deficits: Able to move functionally, however c/o pain in R lateral thigh from fall limiting strength.   RLE Sensation: Deficits RLE Sensation Deficits: During ambulation pt indicates R LE beginning to feel numb, but unable to clarify location or intensity.   Left Lower Extremity Assessment LLE ROM/Strength/Tone: WFL for tasks assessed LLE Sensation: WFL - Light Touch Trunk Assessment Trunk Assessment: Normal   Balance Balance Balance Assessed: No  End of Session PT - End of Session Equipment Utilized During Treatment: Gait belt Activity Tolerance: Patient tolerated treatment well Patient left: in chair;with call bell/phone within reach Nurse Communication: Mobility status  GP     Sunny Schlein,  [phone] 07/02/2012, 10:04 AM

## 2012-07-02 NOTE — Evaluation (Addendum)
Speech Language Pathology Evaluation Patient Details Name: Darlene Meyer MRN: [id number] DOB: Feb 05, 1956 Today's Date: 07/02/2012 Time: 7829-5621 SLP Time Calculation (min): 36 min  Problem List:  Patient Active Problem List  Diagnosis  . Numbness and tingling of right arm and leg  . Numbness and tingling of right face  . Nicotine dependence   Past Medical History:  Past Medical History  Diagnosis Date  . Thyroid disease   . Asthma   . Hypercholesteremia     "before gastric OR; not now" (07/01/2012)  . Hypertension     "before gastric OR; not now" (07/01/2012)  . CHF (congestive heart failure)     "before gastric OR; not now" (07/01/2012)  . Lung abnormality     "found spot on lung sometime last year; was to have this rechecked in 1 yr; haven't done that" (07/01/2012)  . Shortness of breath     "related to ?allergies" (07/01/2012)  . COPD (chronic obstructive pulmonary disease)   . Pacemaker     "before gastric OR; not now" (07/01/2012)  . GERD (gastroesophageal reflux disease)     "before gastric OR; not now" (07/01/2012)  . Migraines     "very rarely" (07/01/2012)  . Sensitiveness to light     "related to migraines" (07/01/2012)  . Arthritis     "little bit" (07/01/2012)  . Fibromyalgia     "3 of my sisters have it; I have alot of the same symptoms" (07/01/2012)  . Chronic lower back pain   . Depression     "got off RX ~ 32yr ago" (07/01/2012)   Past Surgical History:  Past Surgical History  Procedure Laterality Date  . 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 Thyroidectomy, partial  2000's    Tumor was benign  . Carpal tunnel release Bilateral 2000's  . Abdominal hysterectomy  1993  . Cesarean section  1977; 1979  . Appendectomy  02/1971   HPI:  Pt presented to the ED complaining of right sided numbness as well as difficulty obtaining the right words since Wednesday 06/29/12.  Since the problem has persisted patient decided to come to the ED for further evaluation. Pt failed Rn stroke  swallow screen in the ED, SLP was called to ED for swallow eval. Pt had Upper GI s/p gastric bypass in 2005.   MRI, CT head did not show acute findings.     Assessment / Plan / Recommendation Clinical Impression  Pt presents with symptoms consistent with a mild Broca's type aphasia with good language comprehension, impaired repetition and pt groping for words with dysfluencies.  Verbal apraxia present with inconsistent errors to which pt is aware.  Pt is able to verbally express her needs, albet with delay. At times speech appears fluent without deficits.    Cognition appeared intact as pt was able to answer functional math questions, convergent naming tasks, and was orientedx4. Pt able to name 17 animals in 60 seconds without deficits.  Suspect more apraxia than word finding deficits as differential diagnosis.  ? Source of findings given MRI results?    Pt is a Archivist taking prerequisites for nursing school and she reports decreased ability to recall information studied for a test after a fall.    Rec follow up SLP as outpt to maximize pt's speech abilities, especially given her communication demands as Archivist.  SLP educated pt to tips to compensate for her apraxia and to recommendations for outpt SLP-  She states she has someone who can drive  her for therapy.      SLP Assessment  Patient needs continued Speech Lanaguage Pathology Services    Follow Up Recommendations  Outpatient SLP    Frequency and Duration min 2x/week  1 week   Pertinent Vitals/Pain Afebrile, decreased   SLP Goals  SLP Goals Potential to Achieve Goals: Good SLP Goal #1: Pt will demonstrate compensatory strategies to compensate for apraxia with min assist at phrase level.   SLP Goal #2: Pt will read complex paragraph level material and answer questions with 90% accuracy and modified independence. SLP Goal #3: Pt will write moderate complex paragraph level information with 90% accuracy and modified  independence.    SLP Evaluation Prior Functioning  Cognitive/Linguistic Baseline: Within functional limits Type of Home: House Lives With:  (2 Sisters, Niece) Available Help at Discharge: Family;Available 24 hours/day Education:  (pt in college, pre-nursing classes) Vocation: Student   Cognition  Arousal/Alertness: Awake/alert Orientation Level: Oriented X4 Attention: Selective Selective Attention: Appears intact Memory: Appears intact Awareness: Appears intact (awareness to speech deficits) Problem Solving: Appears intact Behaviors: Restless;Lability (? mild lability vs sadness from medical event) Safety/Judgment: Appears intact (pt asked for call bell to be turned off to get oob, cue need)    Comprehension  Auditory Comprehension Overall Auditory Comprehension: Appears within functional limits for tasks assessed Commands: Within Functional Limits Conversation: Complex Visual Recognition/Discrimination Discrimination: Within Function Limits Reading Comprehension Reading Status: Within funtional limits    Expression Expression Primary Mode of Expression: Verbal Verbal Expression Overall Verbal Expression: Impaired Initiation: Impaired (pauses with initiation) Level of Generative/Spontaneous Verbalization: Phrase;Sentence;Conversation Repetition: Impaired Level of Impairment:  (multisyllabic level) Naming: No impairment Pragmatics: No impairment Non-Verbal Means of Communication: Not applicable   Oral / Motor Oral Motor/Sensory Function Overall Oral Motor/Sensory Function: Impaired Labial ROM: Within Functional Limits Labial Symmetry: Within Functional Limits Labial Strength: Within Functional Limits Labial Sensation: Reduced Lingual ROM: Within Functional Limits Lingual Symmetry: Within Functional Limits Lingual Strength: Within Functional Limits Lingual Sensation: Reduced Facial ROM: Within Functional Limits Facial Symmetry: Within Functional Limits Facial  Strength: Within Functional Limits Facial Sensation: Reduced Velum: Within Functional Limits Mandible: Within Functional Limits Motor Speech Overall Motor Speech: Impaired Respiration: Within functional limits Phonation: Normal Resonance: Within functional limits Articulation: Impaired Intelligibility: Intelligible Motor Planning: Impaired Level of Impairment: Phrase (inconsistent errors) Motor Speech Errors: Aware;Groping for words;Inconsistent Effective Techniques: Slow rate;Pause   GO     Donavan Burnet, MS [facility] SLP [phone]

## 2012-07-02 NOTE — Evaluation (Signed)
Occupational Therapy Evaluation and Discharge Patient Details Name: Darlene Meyer MRN: [id number] DOB: [birth year] Today's Date: 07/02/2012 Time: 4098-1191 OT Time Calculation (min): 17 min  OT Assessment / Plan / Recommendation Clinical Impression  This 57 yo female admiited with right sided numbness and tinglilng presents to acute OT without functional BADL deficits, acute OT will sign off.    OT Assessment  Patient does not need any further OT services    Follow Up Recommendations  No OT follow up       Equipment Recommendations  None recommended by OT    Recommendations for Other Services  (Neuro-psy consult for return to school)     Precautions / Restrictions Precautions Precautions: None Restrictions Weight Bearing Restrictions: No       ADL  Transfers/Ambulation Related to ADLs: Independent with all ADL Comments: Independent with all BADLs        Visit Information  Last OT Received On: 07/02/12 Assistance Needed: +1    Subjective Data  Subjective: I'm not really sure I can study like I need to since I am having trouble with words right now (pt is taking classes at [employer], per her son she can apply for a medical leave)   Prior Functioning     Home Living Lives With:  (2 sisters and a niece) Available Help at Discharge: Family;Available 24 hours/day Type of Home: House Home Access: Stairs to enter [employer] of Steps: 1 Entrance Stairs-Rails: None Home Layout: One level Bathroom Shower/Tub: Forensic scientist: Standard Bathroom Accessibility: No Home Adaptive Equipment: None Prior Function Level of Independence: Independent Able to Take Stairs?: Yes Driving: Yes Vocation: Student Comments: Pt is taking pre-requiste classes for nursing school Communication Communication: Expressive difficulties Dominant Hand: Right         Vision/Perception Vision - History Baseline Vision: Wears glasses only for reading Patient  Visual Report: No change from baseline Vision - Assessment Eye Alignment: Within Functional Limits Vision Assessment: Vision tested Ocular Range of Motion: Within Functional Limits Alignment/Gaze Preference: Within Defined Limits Tracking/Visual Pursuits: Able to track stimulus in all quads without difficulty Saccades: Additional eye shifts occurred during testing Convergence: Within functional limits Visual Fields: No apparent deficits   Cognition  Cognition Overall Cognitive Status: Appears within functional limits for tasks assessed/performed Arousal/Alertness: Awake/alert Orientation Level: Appears intact for tasks assessed Behavior During Session: [facility] for tasks performed Cognition - Other Comments: Speech is slow sometimes. Pt states she knows what words she wants to say, but cannot always get them out at a normal pace    Extremity/Trunk Assessment Right Upper Extremity Assessment RUE ROM/Strength/Tone: Within functional levels (Does have old carpal tunnel deficits) RUE Sensation: Deficits RUE Sensation Deficits: Can feel when right arm is touched, just does not feel the same as the left arm RUE Coordination: WFL - gross/fine motor Left Upper Extremity Assessment LUE ROM/Strength/Tone: Within functional levels LUE Sensation: WFL - Light Touch LUE Coordination: WFL - gross/fine motor     Mobility Bed Mobility Bed Mobility: Supine to Sit;Sitting - Scoot to Edge of Bed;Sit to Supine Supine to Sit: 6: Modified independent (Device/Increase time);HOB elevated Sitting - Scoot to Edge of Bed: 7: Independent Sit to Supine: 6: Modified independent (Device/Increase time);HOB elevated Details for Bed Mobility Assistance: No c/o of right thigh pain with me this PM Transfers Transfers: Sit to Stand;Stand to Sit Sit to Stand: 7: Independent;With upper extremity assist;From bed Stand to Sit: 7: Independent;With upper extremity assist;To bed  End of Session OT - End of  Session Activity Tolerance: Patient tolerated treatment well Patient left: in bed;with family/visitor present    Sandy, Haye [phone] 07/02/2012, 3:27 PM

## 2012-07-02 NOTE — Progress Notes (Signed)
[facility] PROGRESS NOTE  Darlene Meyer WUJ:[id number] DOB: 11-14-1955 DOA: 07/01/2012 PCP: Provider Not In System  Assessment/Plan: 1. Numbness in right side of body (arms, legs, face) - Given stroke like symptoms will place routine stroke work up  - place on aspirin 325 mg daily  - MRI/MRA of head negative. Although please review report for MRA which recommends f/u in 6 months to reassess area suspicious for aneurysm. - carotid doppler pending. - Echocardiogram pending. - Pt/Ot - speech therapy evaluation suspicious for a mild Broca's type aphasia with good language comprehension reportedly. - fasting lipid profile shows elevated ldl > 100 therefore will place on zocor unless otherwise mentioned by neurology.  2. Nicotine dependence  - will place on nicotine patch  - recommend cessation.  3. Seasonal allergies - Place order for claritin  While in house.   Code Status: full Family Communication: None at bedside. Disposition Plan:  Pending further work up   Consultants:  Neurology  Procedures:  Please see above  Antibiotics:  none   HPI/Subjective: No new complaints. No acute issues overnight. Reports numbness is resolving but still having difficulty with speech  Objective: Filed Vitals:   07/02/12 0100 07/02/12 0200 07/02/12 0400 07/02/12 0800  BP: 104/48 109/55 98/37 116/51  Pulse:  51 47 64  Temp: 98 F (36.7 C) 97.6 F (36.4 C) 97.8 F (36.6 C) 98.3 F (36.8 C)  TempSrc:    Oral  Resp:  16 20 18   Height:      Weight:      SpO2: 100% 98% 97% 98%    Intake/Output Summary (Last 24 hours) at 07/02/12 1319 Last data filed at 07/02/12 0817  Gross per 24 hour  Intake   1080 ml  Output      0 ml  Net   1080 ml   Filed Weights   07/01/12 1632  Weight: 86.6 kg (190 lb 14.7 oz)    Exam:   General:  Pt in NAD, Alert and Oriented   Cardiovascular: RRR, No MRG  Respiratory: CTA BL, no wheezes  Abdomen: soft, NT, ND  Musculoskeletal: no  cyanosis or clubbing.   Data Reviewed: Basic Metabolic Panel:  Recent Labs Lab 07/01/12 1356  NA 141  K 4.2  CL 107  GLUCOSE 80  BUN 13  CREATININE 0.60   Liver Function Tests: No results found for this basename: AST, ALT, ALKPHOS, BILITOT, PROT, ALBUMIN,  in the last 168 hours No results found for this basename: LIPASE, AMYLASE,  in the last 168 hours No results found for this basename: AMMONIA,  in the last 168 hours CBC:  Recent Labs Lab 07/01/12 1335 07/01/12 1356  WBC 9.1  --   NEUTROABS 4.4  --   HGB 13.9 14.3  HCT 41.4 42.0  MCV 89.2  --   PLT 210  --    Cardiac Enzymes: No results found for this basename: CKTOTAL, CKMB, CKMBINDEX, TROPONINI,  in the last 168 hours BNP (last 3 results) No results found for this basename: PROBNP,  in the last 8760 hours CBG:  Recent Labs Lab 07/01/12 1302  GLUCAP 83    No results found for this or any previous visit (from the past 240 hour(s)).   Studies: Dg Chest 2 View  07/01/2012  *RADIOLOGY REPORT*  Clinical Data: Blurred vision and slurred speech.  CHEST - 2 VIEW  Comparison: 06/21/2011  Findings: No pulmonary infiltrates, edema or pleural fluid identified.  Heart size is within normal limits.  There  is a stable thoracic scoliosis.  IMPRESSION: No active disease.   Original Report Authenticated By: Irish Lack, M.D.    Ct Head Wo Contrast  07/01/2012  *RADIOLOGY REPORT*  Clinical Data: Numbness and tingling of the lips and tongue.  CT HEAD WITHOUT CONTRAST  Technique:  Contiguous axial images were obtained from the base of the skull through the vertex without contrast.  Comparison: CT head without contrast 05/26/2004  Findings: No acute cortical infarct, hemorrhage, mass lesion is present.  The ventricles are of normal size.  No significant extra- axial fluid collection is present.  The fluid levels present in the right maxillary sinus.  Minimal mucosal thickening or fluid is present in the left to right sphenoid sinus.   The paranasal sinuses and mastoid air cells are otherwise clear.  IMPRESSION:  1.  Normal CT appearance of the brain. 2.  Fluid level of the right maxillary sinus compatible with acute sinusitis.   Original Report Authenticated By: Marin Roberts, M.D.    Mr Brain Wo Contrast  07/01/2012  *RADIOLOGY REPORT*  Clinical Data:   Right-sided numbness with difficulty obtaining correct words since 06/29/2012.  Blurred vision and headaches.  MRI BRAIN WITHOUT CONTRAST MRA HEAD WITHOUT CONTRAST  Technique: Multiplanar, multiecho pulse sequences of the brain and surrounding structures were obtained according to standard protocol without intravenous contrast.  Angiographic images of the head were obtained using MRA technique without contrast.  Comparison: 07/01/2012 CT.  No comparison MR.  MRI HEAD  Findings:  No acute infarct.  No intracranial hemorrhage.  Low-lying cerebellar tonsils 6.9 mm below the foramen magnum without a pointed appearance.  Heterogeneous appearance of bone marrow of the clivus possibly normal for this patient.  If there are any symptoms referable to this region this can be evaluated follow-up.  No hydrocephalus.  Empty sella.  This may be an incidental finding although also described in patients with pseudotumor cerebri.  No other findings to suggest this diagnosis.  Major intracranial vascular structures are patent.  Right maxillary sinus air fluid level.  Minimal mucosal thickening remainder of paranasal sinuses.  IMPRESSION: No acute infarct.  Low-lying cerebellar tonsils 6.9 mm below the foramen magnum without a pointed appearance.  Heterogeneous appearance of bone marrow of the clivus possibly normal for this patient.  Empty sella.  Right maxillary sinus air fluid level.  MRA HEAD  Findings: Mild motion degradation.  Anterior circulation without medium or large size vessel significant stenosis or occlusion.  Bulge at the level of the origin of the right posterior communicating artery  measures up to 2.9 mm. This is at the upper limits of size acceptable for prominent infundibulum, bordering size of a small aneurysm (3 mm).  Recommend confirming stability on follow-up exam in 6 months.  No significant stenosis of the vertebral arteries and basilar artery.  Slightly patulous appearance of the basilar tip without discrete saccular aneurysm. Stability of this appearance can also be confirmed on the follow-up examination.  Nonvisualization AICAs.  Irregularity superior cerebellar arteries bilaterally.  Posterior cerebral artery, PICA and middle cerebral artery mild branch vessel irregularity bilaterally.  IMPRESSION: No medium or large size vessel significant stenosis or occlusion.  Mild branch vessel irregularity as noted above.  Bulge at the origin of the right posterior communicating artery may represent an infundibulum however, as this borders the dimensions usually utilized for an aneurysm, stability will need to be confirmed with follow-up examination in 6 months.  At such time, attention to slightly patulous appearance of basilar  tip also recommended.   Original Report Authenticated By: Lacy Duverney, M.D.    Mr Mra Head/brain Wo Cm  07/01/2012  *RADIOLOGY REPORT*  Clinical Data:   Right-sided numbness with difficulty obtaining correct words since 06/29/2012.  Blurred vision and headaches.  MRI BRAIN WITHOUT CONTRAST MRA HEAD WITHOUT CONTRAST  Technique: Multiplanar, multiecho pulse sequences of the brain and surrounding structures were obtained according to standard protocol without intravenous contrast.  Angiographic images of the head were obtained using MRA technique without contrast.  Comparison: 07/01/2012 CT.  No comparison MR.  MRI HEAD  Findings:  No acute infarct.  No intracranial hemorrhage.  Low-lying cerebellar tonsils 6.9 mm below the foramen magnum without a pointed appearance.  Heterogeneous appearance of bone marrow of the clivus possibly normal for this patient.  If there are  any symptoms referable to this region this can be evaluated follow-up.  No hydrocephalus.  Empty sella.  This may be an incidental finding although also described in patients with pseudotumor cerebri.  No other findings to suggest this diagnosis.  Major intracranial vascular structures are patent.  Right maxillary sinus air fluid level.  Minimal mucosal thickening remainder of paranasal sinuses.  IMPRESSION: No acute infarct.  Low-lying cerebellar tonsils 6.9 mm below the foramen magnum without a pointed appearance.  Heterogeneous appearance of bone marrow of the clivus possibly normal for this patient.  Empty sella.  Right maxillary sinus air fluid level.  MRA HEAD  Findings: Mild motion degradation.  Anterior circulation without medium or large size vessel significant stenosis or occlusion.  Bulge at the level of the origin of the right posterior communicating artery measures up to 2.9 mm. This is at the upper limits of size acceptable for prominent infundibulum, bordering size of a small aneurysm (3 mm).  Recommend confirming stability on follow-up exam in 6 months.  No significant stenosis of the vertebral arteries and basilar artery.  Slightly patulous appearance of the basilar tip without discrete saccular aneurysm. Stability of this appearance can also be confirmed on the follow-up examination.  Nonvisualization AICAs.  Irregularity superior cerebellar arteries bilaterally.  Posterior cerebral artery, PICA and middle cerebral artery mild branch vessel irregularity bilaterally.  IMPRESSION: No medium or large size vessel significant stenosis or occlusion.  Mild branch vessel irregularity as noted above.  Bulge at the origin of the right posterior communicating artery may represent an infundibulum however, as this borders the dimensions usually utilized for an aneurysm, stability will need to be confirmed with follow-up examination in 6 months.  At such time, attention to slightly patulous appearance of basilar  tip also recommended.   Original Report Authenticated By: Lacy Duverney, M.D.     Scheduled Meds: .  stroke: mapping our early stages of recovery book   Does not apply Once  . aspirin  300 mg Rectal Daily   Or  . aspirin  325 mg Oral Daily  . loratadine  10 mg Oral Daily  . nicotine  14 mg Transdermal Daily   Continuous Infusions:   Active Problems:   Numbness and tingling of right arm and leg   Numbness and tingling of right face   Nicotine dependence    Time spent: > 35 minutes    Penny Pia  [facility] Pager [phone]. If 7PM-7AM, please contact night-coverage at [email or web address], password [facility] 07/02/2012, 1:19 PM  LOS: 1 day

## 2012-07-03 DIAGNOSIS — G459 Transient cerebral ischemic attack, unspecified: Secondary | ICD-10-CM

## 2012-07-03 MED ORDER — ASPIRIN 325 MG PO TABS: 325.0000 mg | ORAL_TABLET | Freq: Every day | ORAL | Status: DC

## 2012-07-03 MED ORDER — LOVASTATIN 20 MG PO TABS: 20.0000 mg | ORAL_TABLET | Freq: Every day | ORAL | Status: DC

## 2012-07-03 MED ORDER — SIMVASTATIN 20 MG PO TABS: 20.0000 mg | ORAL_TABLET | Freq: Every day | ORAL | Status: DC

## 2012-07-03 NOTE — Progress Notes (Signed)
  Echocardiogram 2D Echocardiogram has been performed.  Darlene Meyer 07/03/2012, 12:20 PM

## 2012-07-03 NOTE — Discharge Summary (Signed)
Physician Discharge Summary  Darlene Meyer WUJ:[id number] DOB: [birth year] DOA: 07/01/2012  PCP: Provider Not In System  Admit date: 07/01/2012 Discharge date: 07/03/2012  Time spent: > 35 minutes  Recommendations for Outpatient Follow-up:  1. Please be sure to follow up with your pcp in 1-2 weeks or sooner. 2. Please be sure to encourage tobacco cessation 3. Suspected TIA patient should be on aspirin daily 4. MRA suggests rechecking in 6 months to rule out aneurysm at area suspicious please see below.  Discharge Diagnoses:  Active Problems:   Numbness and tingling of right arm and leg   Numbness and tingling of right face   Nicotine dependence   Seasonal allergies   TIA (transient ischemic attack)   Discharge Condition: stable  Diet recommendation: Heart healthy diet.  Filed Weights   07/01/12 1632  Weight: 86.6 kg (190 lb 14.7 oz)    History of present illness:  57 y/o with history of nicotine dependence and seasonal allergies presenting with numbness of her right side.  Hospital Course:  Neurologist recommended the following:  Possible TIA with improvement in symptomatology over the past 24 hours since admission. MRI of the brain showed no signs of acute stroke. Carotid Doppler study preliminary report indicated no hemodynamically significant carotid or vertebral artery lesion. Hemoglobin A1c was normal. Fasting lipid panel showed a slight elevation in LDL (109). Echocardiogram is pending.  No changes in current management recommended, including continued antiplatelet therapy with aspirin daily. If echocardiogram is unremarkable, no further neurological intervention is indicated.  1. Numbness in right side of body (arms, legs, face) - Neurology's evaluation as above, suspecting TIA - place on aspirin 325 mg daily as recommended by neuro - MRI/MRA of head negative. Although please review report for MRA which recommends f/u in 6 months to reassess area suspicious for aneurysm.  -  carotid doppler pending.  - Echocardiogram showed no embolic source with normal EF of 60-65% with normal wall motion.  - speech therapy evaluation suspicious for a mild Broca's type aphasia with good language comprehension reportedly.  Patient given information for recommended outpatient speech therapy. - fasting lipid profile shows elevated ldl > 100 therefore placed on zocor  2. Nicotine dependence  - placed on nicotine patch while in house. - recommend cessation.   3. Seasonal allergies  - Place order for claritin While in house.    Procedures:  MRI/MRA of head  echocardiogram  Consultations:  Neurology: Dr. Roseanne Reno  Discharge Exam: Filed Vitals:   07/02/12 2000 07/02/12 2341 07/03/12 0400 07/03/12 0900  BP: 119/77 110/69 112/71 121/68  Pulse: 67 66 57 61  Temp: 98.2 F (36.8 C) 97.6 F (36.4 C) 97.7 F (36.5 C) 98.7 F (37.1 C)  TempSrc:    Oral  Resp: 20 20 18    Height:      Weight:      SpO2: 96% 96% 97% 98%    General: Pt in NAD, Alert and Oriented Cardiovascular: RRR, No MRG Respiratory: CTA BL, no wheezes  Discharge Instructions  Discharge Orders   Future Orders Complete By Expires     Call MD for:  extreme fatigue  As directed     Call MD for:  persistant dizziness or light-headedness  As directed     Diet - low sodium heart healthy  As directed     Discharge instructions  As directed     Comments:      Please follow up with a neurologist of your choice in 2-3 weeks or  sooner should any new concerns arise.    Increase activity slowly  As directed         Medication List    STOP taking these medications       ADVIL 200 MG tablet  Generic drug:  ibuprofen     diphenhydrAMINE 25 MG tablet  Commonly known as:  SOMINEX      TAKE these medications       acetaminophen 500 MG tablet  Commonly known as:  TYLENOL  Take 500 mg by mouth every 6 (six) hours as needed. For pain relief     aspirin 325 MG tablet  Take 1 tablet (325 mg total) by  mouth daily.     CALCIUM-VITAMIN D PO  Take 1 tablet by mouth daily.     cetirizine 10 MG tablet  Commonly known as:  ZYRTEC  Take 10 mg by mouth daily.     hydrocortisone cream 1 %  Apply 1 application topically as needed. For psoriasis.     multivitamins ther. w/minerals Tabs  Take 1 tablet by mouth daily.     simvastatin 20 MG tablet  Commonly known as:  ZOCOR  Take 1 tablet (20 mg total) by mouth daily at 6 PM.          The results of significant diagnostics from this hospitalization (including imaging, microbiology, ancillary and laboratory) are listed below for reference.    Significant Diagnostic Studies: Dg Chest 2 View  07/01/2012  *RADIOLOGY REPORT*  Clinical Data: Blurred vision and slurred speech.  CHEST - 2 VIEW  Comparison: 06/21/2011  Findings: No pulmonary infiltrates, edema or pleural fluid identified.  Heart size is within normal limits.  There is a stable thoracic scoliosis.  IMPRESSION: No active disease.   Original Report Authenticated By: Irish Lack, M.D.    Ct Head Wo Contrast  07/01/2012  *RADIOLOGY REPORT*  Clinical Data: Numbness and tingling of the lips and tongue.  CT HEAD WITHOUT CONTRAST  Technique:  Contiguous axial images were obtained from the base of the skull through the vertex without contrast.  Comparison: CT head without contrast 05/26/2004  Findings: No acute cortical infarct, hemorrhage, mass lesion is present.  The ventricles are of normal size.  No significant extra- axial fluid collection is present.  The fluid levels present in the right maxillary sinus.  Minimal mucosal thickening or fluid is present in the left to right sphenoid sinus.  The paranasal sinuses and mastoid air cells are otherwise clear.  IMPRESSION:  1.  Normal CT appearance of the brain. 2.  Fluid level of the right maxillary sinus compatible with acute sinusitis.   Original Report Authenticated By: Marin Roberts, M.D.    Mr Brain Wo Contrast  07/01/2012  *RADIOLOGY  REPORT*  Clinical Data:   Right-sided numbness with difficulty obtaining correct words since 06/29/2012.  Blurred vision and headaches.  MRI BRAIN WITHOUT CONTRAST MRA HEAD WITHOUT CONTRAST  Technique: Multiplanar, multiecho pulse sequences of the brain and surrounding structures were obtained according to standard protocol without intravenous contrast.  Angiographic images of the head were obtained using MRA technique without contrast.  Comparison: 07/01/2012 CT.  No comparison MR.  MRI HEAD  Findings:  No acute infarct.  No intracranial hemorrhage.  Low-lying cerebellar tonsils 6.9 mm below the foramen magnum without a pointed appearance.  Heterogeneous appearance of bone marrow of the clivus possibly normal for this patient.  If there are any symptoms referable to this region this can be evaluated follow-up.  No hydrocephalus.  Empty sella.  This may be an incidental finding although also described in patients with pseudotumor cerebri.  No other findings to suggest this diagnosis.  Major intracranial vascular structures are patent.  Right maxillary sinus air fluid level.  Minimal mucosal thickening remainder of paranasal sinuses.  IMPRESSION: No acute infarct.  Low-lying cerebellar tonsils 6.9 mm below the foramen magnum without a pointed appearance.  Heterogeneous appearance of bone marrow of the clivus possibly normal for this patient.  Empty sella.  Right maxillary sinus air fluid level.  MRA HEAD  Findings: Mild motion degradation.  Anterior circulation without medium or large size vessel significant stenosis or occlusion.  Bulge at the level of the origin of the right posterior communicating artery measures up to 2.9 mm. This is at the upper limits of size acceptable for prominent infundibulum, bordering size of a small aneurysm (3 mm).  Recommend confirming stability on follow-up exam in 6 months.  No significant stenosis of the vertebral arteries and basilar artery.  Slightly patulous appearance of the  basilar tip without discrete saccular aneurysm. Stability of this appearance can also be confirmed on the follow-up examination.  Nonvisualization AICAs.  Irregularity superior cerebellar arteries bilaterally.  Posterior cerebral artery, PICA and middle cerebral artery mild branch vessel irregularity bilaterally.  IMPRESSION: No medium or large size vessel significant stenosis or occlusion.  Mild branch vessel irregularity as noted above.  Bulge at the origin of the right posterior communicating artery may represent an infundibulum however, as this borders the dimensions usually utilized for an aneurysm, stability will need to be confirmed with follow-up examination in 6 months.  At such time, attention to slightly patulous appearance of basilar tip also recommended.   Original Report Authenticated By: Lacy Duverney, M.D.    Mr Mra Head/brain Wo Cm  07/01/2012  *RADIOLOGY REPORT*  Clinical Data:   Right-sided numbness with difficulty obtaining correct words since 06/29/2012.  Blurred vision and headaches.  MRI BRAIN WITHOUT CONTRAST MRA HEAD WITHOUT CONTRAST  Technique: Multiplanar, multiecho pulse sequences of the brain and surrounding structures were obtained according to standard protocol without intravenous contrast.  Angiographic images of the head were obtained using MRA technique without contrast.  Comparison: 07/01/2012 CT.  No comparison MR.  MRI HEAD  Findings:  No acute infarct.  No intracranial hemorrhage.  Low-lying cerebellar tonsils 6.9 mm below the foramen magnum without a pointed appearance.  Heterogeneous appearance of bone marrow of the clivus possibly normal for this patient.  If there are any symptoms referable to this region this can be evaluated follow-up.  No hydrocephalus.  Empty sella.  This may be an incidental finding although also described in patients with pseudotumor cerebri.  No other findings to suggest this diagnosis.  Major intracranial vascular structures are patent.  Right  maxillary sinus air fluid level.  Minimal mucosal thickening remainder of paranasal sinuses.  IMPRESSION: No acute infarct.  Low-lying cerebellar tonsils 6.9 mm below the foramen magnum without a pointed appearance.  Heterogeneous appearance of bone marrow of the clivus possibly normal for this patient.  Empty sella.  Right maxillary sinus air fluid level.  MRA HEAD  Findings: Mild motion degradation.  Anterior circulation without medium or large size vessel significant stenosis or occlusion.  Bulge at the level of the origin of the right posterior communicating artery measures up to 2.9 mm. This is at the upper limits of size acceptable for prominent infundibulum, bordering size of a small aneurysm (3 mm).  Recommend confirming  stability on follow-up exam in 6 months.  No significant stenosis of the vertebral arteries and basilar artery.  Slightly patulous appearance of the basilar tip without discrete saccular aneurysm. Stability of this appearance can also be confirmed on the follow-up examination.  Nonvisualization AICAs.  Irregularity superior cerebellar arteries bilaterally.  Posterior cerebral artery, PICA and middle cerebral artery mild branch vessel irregularity bilaterally.  IMPRESSION: No medium or large size vessel significant stenosis or occlusion.  Mild branch vessel irregularity as noted above.  Bulge at the origin of the right posterior communicating artery may represent an infundibulum however, as this borders the dimensions usually utilized for an aneurysm, stability will need to be confirmed with follow-up examination in 6 months.  At such time, attention to slightly patulous appearance of basilar tip also recommended.   Original Report Authenticated By: Lacy Duverney, M.D.     Microbiology: No results found for this or any previous visit (from the past 240 hour(s)).   Labs: Basic Metabolic Panel:  Recent Labs Lab 07/01/12 1356  NA 141  K 4.2  CL 107  GLUCOSE 80  BUN 13  CREATININE  0.60   Liver Function Tests: No results found for this basename: AST, ALT, ALKPHOS, BILITOT, PROT, ALBUMIN,  in the last 168 hours No results found for this basename: LIPASE, AMYLASE,  in the last 168 hours No results found for this basename: AMMONIA,  in the last 168 hours CBC:  Recent Labs Lab 07/01/12 1335 07/01/12 1356  WBC 9.1  --   NEUTROABS 4.4  --   HGB 13.9 14.3  HCT 41.4 42.0  MCV 89.2  --   PLT 210  --    Cardiac Enzymes: No results found for this basename: CKTOTAL, CKMB, CKMBINDEX, TROPONINI,  in the last 168 hours BNP: BNP (last 3 results) No results found for this basename: PROBNP,  in the last 8760 hours CBG:  Recent Labs Lab 07/01/12 1302  GLUCAP 83       Signed:  Penny Pia  [facility] 07/03/2012, 1:43 PM

## 2013-02-02 ENCOUNTER — Other Ambulatory Visit: Payer: Self-pay

## 2013-03-10 ENCOUNTER — Emergency Department (HOSPITAL_COMMUNITY): Payer: Self-pay

## 2013-03-10 ENCOUNTER — Encounter (HOSPITAL_COMMUNITY): Payer: Self-pay | Admitting: Emergency Medicine

## 2013-03-10 ENCOUNTER — Emergency Department (HOSPITAL_COMMUNITY)
Admission: EM | Admit: 2013-03-10 | Discharge: 2013-03-10 | Disposition: A | Discharge status: Home / Self Care | Payer: Self-pay | Attending: Emergency Medicine | Admitting: Emergency Medicine

## 2013-03-10 DIAGNOSIS — G8929 Other chronic pain: Secondary | ICD-10-CM | POA: Insufficient documentation

## 2013-03-10 DIAGNOSIS — J441 Chronic obstructive pulmonary disease with (acute) exacerbation: Secondary | ICD-10-CM | POA: Insufficient documentation

## 2013-03-10 DIAGNOSIS — IMO0001 Reserved for inherently not codable concepts without codable children: Secondary | ICD-10-CM | POA: Insufficient documentation

## 2013-03-10 DIAGNOSIS — Z8719 Personal history of other diseases of the digestive system: Secondary | ICD-10-CM | POA: Insufficient documentation

## 2013-03-10 DIAGNOSIS — Z9884 Bariatric surgery status: Secondary | ICD-10-CM | POA: Insufficient documentation

## 2013-03-10 DIAGNOSIS — Z8659 Personal history of other mental and behavioral disorders: Secondary | ICD-10-CM | POA: Insufficient documentation

## 2013-03-10 DIAGNOSIS — F17200 Nicotine dependence, unspecified, uncomplicated: Secondary | ICD-10-CM | POA: Insufficient documentation

## 2013-03-10 DIAGNOSIS — Z7982 Long term (current) use of aspirin: Secondary | ICD-10-CM | POA: Insufficient documentation

## 2013-03-10 DIAGNOSIS — Z79899 Other long term (current) drug therapy: Secondary | ICD-10-CM | POA: Insufficient documentation

## 2013-03-10 DIAGNOSIS — R002 Palpitations: Secondary | ICD-10-CM | POA: Insufficient documentation

## 2013-03-10 DIAGNOSIS — M129 Arthropathy, unspecified: Secondary | ICD-10-CM | POA: Insufficient documentation

## 2013-03-10 DIAGNOSIS — Z950 Presence of cardiac pacemaker: Secondary | ICD-10-CM | POA: Insufficient documentation

## 2013-03-10 DIAGNOSIS — E785 Hyperlipidemia, unspecified: Secondary | ICD-10-CM | POA: Insufficient documentation

## 2013-03-10 DIAGNOSIS — I509 Heart failure, unspecified: Secondary | ICD-10-CM | POA: Insufficient documentation

## 2013-03-10 DIAGNOSIS — I10 Essential (primary) hypertension: Secondary | ICD-10-CM | POA: Insufficient documentation

## 2013-03-10 DIAGNOSIS — E7800 Pure hypercholesterolemia, unspecified: Secondary | ICD-10-CM | POA: Insufficient documentation

## 2013-03-10 DIAGNOSIS — R61 Generalized hyperhidrosis: Secondary | ICD-10-CM | POA: Insufficient documentation

## 2013-03-10 DIAGNOSIS — R079 Chest pain, unspecified: Secondary | ICD-10-CM | POA: Insufficient documentation

## 2013-03-10 LAB — BASIC METABOLIC PANEL
BUN: 14 mg/dL (ref 6–23)
Chloride: 107 mEq/L (ref 96–112)
Glucose, Bld: 96 mg/dL (ref 70–99)
Potassium: 4.2 mEq/L (ref 3.5–5.1)
Sodium: 138 mEq/L (ref 135–145)

## 2013-03-10 LAB — CBC WITH DIFFERENTIAL/PLATELET
Basophils Absolute: 0 10*3/uL (ref 0.0–0.1)
Basophils Relative: 0 % (ref 0–1)
Eosinophils Absolute: 0.6 10*3/uL (ref 0.0–0.7)
Eosinophils Relative: 7 % — ABNORMAL HIGH (ref 0–5)
HCT: 40.4 % (ref 36.0–46.0)
Hemoglobin: 13.3 g/dL (ref 12.0–15.0)
Lymphocytes Relative: 27 % (ref 12–46)
Lymphs Abs: 2.5 10*3/uL (ref 0.7–4.0)
MCH: 30.4 pg (ref 26.0–34.0)
MCHC: 32.9 g/dL (ref 30.0–36.0)
MCV: 92.2 fL (ref 78.0–100.0)
Monocytes Absolute: 0.7 10*3/uL (ref 0.1–1.0)
Monocytes Relative: 7 % (ref 3–12)
Neutro Abs: 5.5 10*3/uL (ref 1.7–7.7)
Neutrophils Relative %: 59 % (ref 43–77)
Platelets: 203 10*3/uL (ref 150–400)
RBC: 4.38 MIL/uL (ref 3.87–5.11)
RDW: 13.4 % (ref 11.5–15.5)
WBC: 9.3 10*3/uL (ref 4.0–10.5)

## 2013-03-10 LAB — POCT I-STAT TROPONIN I: Troponin i, poc: 0.01 ng/mL (ref 0.00–0.08)

## 2013-03-10 MED ORDER — SODIUM CHLORIDE 0.9 % IV BOLUS (SEPSIS)
500.0000 mL | Freq: Once | INTRAVENOUS | Status: AC
  Administered 2013-03-10: 500 mL via INTRAVENOUS

## 2013-03-10 NOTE — ED Provider Notes (Signed)
Medical screening examination/treatment/procedure(s) were conducted as a shared visit with non-physician practitioner(s) and myself.  I personally evaluated the patient during the encounter.  EKG Interpretation    Date/Time:  Friday March 10 2013 18:05:50 EST Ventricular Rate:  54 PR Interval:  156 QRS Duration: 102 QT Interval:  453 QTC Calculation: 429 R Axis:   80 Text Interpretation:  Sinus rhythm Low voltage, precordial leads No significant change since last tracing Confirmed by Bebe Shaggy  MD, [place] [phone]) on 03/10/2013 6:09:15 PM            Pt well appearing, EKG reviewed and repeat EKG unchanged HEART score 3.  Pain free in the ED.  I don't feel she needs admission and can f/u as outpatient   Joya Gaskins, MD 03/10/13 1929

## 2013-03-10 NOTE — ED Notes (Addendum)
Onset today chest pain with feeling of cool and clammy reported by EMS.  Patient alert and following commands appropriate. EMS gave 324mg  po aspirin

## 2013-03-10 NOTE — ED Provider Notes (Signed)
CSN: [id number]     Arrival date & time 03/10/13  1414 History   First MD Initiated Contact with Patient 03/10/13 1414     Chief Complaint  Patient presents with  . Chest Pain   (Consider location/radiation/quality/duration/timing/severity/associated sxs/prior Treatment) Patient is a 57 y.o. female presenting with chest pain. The history is provided by the patient and medical records.  Chest Pain Associated symptoms: palpitations and shortness of breath    This is a 57 year old female with past medical history significant for thyroid disease, COPD, presenting to the ED for episode of chest pain occuring earlier this morning.  Pt states she was sitting on the couch and begin experiencing mid-sternal and left sided chest pain, described as a burning sensation associated with a flushed feeling, diaphoresis, and palpitations.  States sx lasted approx 5 minutes before resolving completely.  Pt is s/p gastric bypass-- prior hx of HTN, HLP, CHF prior to surgery.  States she does not see cardiology on a regular basis.  Pt is a daily smoker-- goes through 1 pack every 3 days or so.  No prior hx of CAD or MI.  No recent travel, surgeries, LE edema, or calf pain.  No prior hx of DVT or PE.  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  Pt on daily ASA 325mg -- has taken already today.  Pain free on arrival, VS stable.  No PCP at this time-- used to see Billee Cashing PA-C until recently.  Last 2D echocardiogram on 07/03/12 revealing: Left ventricle: The cavity size was normal. Systolic function was normal. The estimated ejection fraction was in the range of 60% to 65%. Wall motion was normal; there were no regional wall motion abnormalities.   Past Medical History  Diagnosis Date  . Thyroid disease   . Asthma   . Hypercholesteremia     "before gastric OR; not now" (07/01/2012)  . Hypertension     "before  gastric OR; not now" (07/01/2012)  . CHF (congestive heart failure)     "before gastric OR; not now" (07/01/2012)  . Lung abnormality     "found spot on lung sometime last year; was to have this rechecked in 1 yr; haven't done that" (07/01/2012)  . Shortness of breath     "related to ?allergies" (07/01/2012)  . COPD (chronic obstructive pulmonary disease)   . Pacemaker     "before gastric OR; not now" (07/01/2012)  . GERD (gastroesophageal reflux disease)     "before gastric OR; not now" (07/01/2012)  . Migraines     "very rarely" (07/01/2012)  . Sensitiveness to light     "related to migraines" (07/01/2012)  . Arthritis     "little bit" (07/01/2012)  . Fibromyalgia     "3 of my sisters have it; I have alot of the same symptoms" (07/01/2012)  . Chronic lower back pain   . Depression     "got off RX ~ 51yr ago" (07/01/2012)   Past Surgical History  Procedure Laterality Date  . 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 Thyroidectomy, partial  2000's    Tumor was benign  . Carpal tunnel release Bilateral 2000's  . Abdominal hysterectomy  1993  . Cesarean section  1977; 1979  . Appendectomy  02/1971   Family History  Problem Relation Age of Onset  . Cancer Mother   . Hyperlipidemia Mother   . Heart failure Mother  History  Substance Use Topics  . Smoking status: Current Every Day Smoker -- 0.33 packs/day for 30 years    Types: Cigarettes  . Smokeless tobacco: Never Used  . Alcohol Use: 0.6 oz/week    1 Glasses of wine per week     Comment: 07/01/2012 "once/month, 3-4 glasses wine"   OB History   Grav Para Term Preterm Abortions TAB SAB Ect Mult Living                 Review of Systems  Respiratory: Positive for shortness of breath.   Cardiovascular: Positive for chest pain and palpitations.  All other systems reviewed and are negative.    Allergies  Review of patient's allergies indicates no known allergies.  Home Medications   Current Outpatient Rx  Name  Route  Sig  Dispense   Refill  . acetaminophen (TYLENOL) 500 MG tablet   Oral   Take 500 mg by mouth every 6 (six) hours as needed. For pain relief         . aspirin 325 MG tablet   Oral   Take 1 tablet (325 mg total) by mouth daily.   30 tablet   0   . CALCIUM-VITAMIN D PO   Oral   Take 1 tablet by mouth daily.         . cetirizine (ZYRTEC) 10 MG tablet   Oral   Take 10 mg by mouth daily.           . hydrocortisone cream 1 %   Topical   Apply 1 application topically as needed. For psoriasis.          Marland Kitchen lovastatin (MEVACOR) 20 MG tablet   Oral   Take 1 tablet (20 mg total) by mouth at bedtime.   30 tablet   0   . Multiple Vitamins-Minerals (MULTIVITAMINS THER. W/MINERALS) TABS   Oral   Take 1 tablet by mouth daily.            BP 102/82  Pulse 63  Temp(Src) 97.8 F (36.6 C) (Oral)  Resp 16  SpO2 97%  Physical Exam  Nursing note and vitals reviewed. Constitutional: She is oriented to person, place, and time. She appears well-developed and well-nourished. No distress.  HENT:  Head: Normocephalic and atraumatic.  Mouth/Throat: Oropharynx is clear and moist.  Eyes: Conjunctivae and EOM are normal. Pupils are equal, round, and reactive to light.  Neck: Normal range of motion. Neck supple.  Cardiovascular: Normal rate, regular rhythm and normal heart sounds.   Pulmonary/Chest: Effort normal and breath sounds normal. No respiratory distress. She has no wheezes.  Abdominal: Soft. Bowel sounds are normal. There is no tenderness. There is no guarding.  Musculoskeletal: Normal range of motion. She exhibits no edema.  Neurological: She is alert and oriented to person, place, and time. She has normal strength. She displays no tremor. No cranial nerve deficit or sensory deficit. She displays no seizure activity.  Skin: Skin is warm. She is not diaphoretic.  Psychiatric: She has a normal mood and affect.    ED Course  Procedures (including critical care time) Labs Review Labs Reviewed   CBC WITH DIFFERENTIAL - Abnormal; Notable for the following:    Eosinophils Relative 7 (*)    All other components within normal limits  PRO B NATRIURETIC PEPTIDE  BASIC METABOLIC PANEL  POCT I-STAT TROPONIN I  POCT I-STAT TROPONIN I   Imaging Review No results found.  EKG Interpretation    Date/Time:  Friday March 10 2013 18:05:50 EST Ventricular Rate:  54 PR Interval:  156 QRS Duration: 102 QT Interval:  453 QTC Calculation: 429 R Axis:   80 Text Interpretation:  Sinus rhythm Low voltage, precordial leads No significant change since last tracing Confirmed by Bebe Shaggy  MD, DONALD [phone]) on 03/10/2013 6:09:15 PM            MDM   1. Chest pain    EKG sinus brady, no acute ischemic changes.  Trop negative.  Labs reassuring.  Repeat EKG and delta trop also negative.  Pt has remained pain free during entire ED visit.  At this time i doubt ACS, PE, dissection, or other acute cardiac event.  Pt does not have PCP but states she would prefer to see her old PA again-- will refer back, also given resource for wellness center.  Discussed signs/sx that would warrant return to the ED including new chest pain, SOB, palpitations, dizziness, weakness, numbness/paresthesias of extremities, etc.  Pt acknowledged understanding and agreed.  Discussed with Dr. Bebe Shaggy who personally evaluated pt and agrees with assessment and plan of care.  Garlon Hatchet, PA-C 03/10/13 1926

## 2014-04-16 ENCOUNTER — Emergency Department (HOSPITAL_COMMUNITY): Payer: Self-pay

## 2014-04-16 ENCOUNTER — Encounter (HOSPITAL_COMMUNITY): Payer: Self-pay | Admitting: Emergency Medicine

## 2014-04-16 ENCOUNTER — Emergency Department (HOSPITAL_COMMUNITY)
Admission: EM | Admit: 2014-04-16 | Discharge: 2014-04-16 | Disposition: A | Discharge status: Home / Self Care | Payer: Self-pay | Attending: Emergency Medicine | Admitting: Emergency Medicine

## 2014-04-16 DIAGNOSIS — Z8659 Personal history of other mental and behavioral disorders: Secondary | ICD-10-CM | POA: Insufficient documentation

## 2014-04-16 DIAGNOSIS — Z720 Tobacco use: Secondary | ICD-10-CM | POA: Insufficient documentation

## 2014-04-16 DIAGNOSIS — Y92007 Garden or yard of unspecified non-institutional (private) residence as the place of occurrence of the external cause: Secondary | ICD-10-CM | POA: Insufficient documentation

## 2014-04-16 DIAGNOSIS — W1839XA Other fall on same level, initial encounter: Secondary | ICD-10-CM | POA: Insufficient documentation

## 2014-04-16 DIAGNOSIS — Z7982 Long term (current) use of aspirin: Secondary | ICD-10-CM | POA: Insufficient documentation

## 2014-04-16 DIAGNOSIS — S20212A Contusion of left front wall of thorax, initial encounter: Secondary | ICD-10-CM | POA: Insufficient documentation

## 2014-04-16 DIAGNOSIS — Y9389 Activity, other specified: Secondary | ICD-10-CM | POA: Insufficient documentation

## 2014-04-16 DIAGNOSIS — S4992XA Unspecified injury of left shoulder and upper arm, initial encounter: Secondary | ICD-10-CM | POA: Insufficient documentation

## 2014-04-16 DIAGNOSIS — Z8669 Personal history of other diseases of the nervous system and sense organs: Secondary | ICD-10-CM | POA: Insufficient documentation

## 2014-04-16 DIAGNOSIS — W19XXXA Unspecified fall, initial encounter: Secondary | ICD-10-CM

## 2014-04-16 DIAGNOSIS — Z79899 Other long term (current) drug therapy: Secondary | ICD-10-CM | POA: Insufficient documentation

## 2014-04-16 DIAGNOSIS — Z951 Presence of aortocoronary bypass graft: Secondary | ICD-10-CM | POA: Insufficient documentation

## 2014-04-16 DIAGNOSIS — I509 Heart failure, unspecified: Secondary | ICD-10-CM | POA: Insufficient documentation

## 2014-04-16 DIAGNOSIS — Z8639 Personal history of other endocrine, nutritional and metabolic disease: Secondary | ICD-10-CM | POA: Insufficient documentation

## 2014-04-16 DIAGNOSIS — M1990 Unspecified osteoarthritis, unspecified site: Secondary | ICD-10-CM | POA: Insufficient documentation

## 2014-04-16 DIAGNOSIS — G8929 Other chronic pain: Secondary | ICD-10-CM | POA: Insufficient documentation

## 2014-04-16 DIAGNOSIS — I10 Essential (primary) hypertension: Secondary | ICD-10-CM | POA: Insufficient documentation

## 2014-04-16 DIAGNOSIS — Y998 Other external cause status: Secondary | ICD-10-CM | POA: Insufficient documentation

## 2014-04-16 MED ORDER — KETOROLAC TROMETHAMINE 60 MG/2ML IM SOLN
60.0000 mg | Freq: Once | INTRAMUSCULAR | Status: AC
  Administered 2014-04-16: 60 mg via INTRAMUSCULAR
  Filled 2014-04-16: qty 2

## 2014-04-16 MED ORDER — DEXAMETHASONE SODIUM PHOSPHATE 10 MG/ML IJ SOLN
10.0000 mg | Freq: Once | INTRAMUSCULAR | Status: AC
  Administered 2014-04-16: 10 mg via INTRAMUSCULAR
  Filled 2014-04-16: qty 1

## 2014-04-16 MED ORDER — OXYCODONE-ACETAMINOPHEN 5-325 MG PO TABS: 1.0000 | ORAL_TABLET | Freq: Four times a day (QID) | ORAL | Status: DC | PRN

## 2014-04-16 NOTE — Discharge Instructions (Signed)
Recommend naproxen twice a day as well as Percocet as prescribed as needed for severe pain. Ice areas of injury 3-4 times per day. Use an incentive spirometer at least once per hour while awake. Follow-up with your primary care doctor for a recheck. Return to the emergency department as needed if symptoms worsen.  Chest Contusion A chest contusion is a deep bruise on your chest area. Contusions are the result of an injury that caused bleeding under the skin. A chest contusion may involve bruising of the skin, muscles, or ribs. The contusion may turn blue, purple, or yellow. Minor injuries will give you a painless contusion, but more severe contusions may stay painful and swollen for a few weeks. CAUSES  A contusion is usually caused by a blow, trauma, or direct force to an area of the body. SYMPTOMS   Swelling and redness of the injured area.  Discoloration of the injured area.  Tenderness and soreness of the injured area.  Pain. DIAGNOSIS  The diagnosis can be made by taking a history and performing a physical exam. An X-ray, CT scan, or MRI may be needed to determine if there were any associated injuries, such as broken bones (fractures) or internal injuries. TREATMENT  Often, the best treatment for a chest contusion is resting, icing, and applying cold compresses to the injured area. Deep breathing exercises may be recommended to reduce the risk of pneumonia. Over-the-counter medicines may also be recommended for pain control. HOME CARE INSTRUCTIONS   Put ice on the injured area.  Put ice in a plastic bag.  Place a towel between your skin and the bag.  Leave the ice on for 15-20 minutes, 03-04 times a day.  Only take over-the-counter or prescription medicines as directed by your caregiver. Your caregiver may recommend avoiding anti-inflammatory medicines (aspirin, ibuprofen, and naproxen) for 48 hours because these medicines may increase bruising.  Rest the injured area.  Perform  deep-breathing exercises as directed by your caregiver.  Stop smoking if you smoke.  Do not lift objects over 5 pounds (2.3 kg) for 3 days or longer if recommended by your caregiver. SEEK IMMEDIATE MEDICAL CARE IF:   You have increased bruising or swelling.  You have pain that is getting worse.  You have difficulty breathing.  You have dizziness, weakness, or fainting.  You have blood in your urine or stool.  You cough up or vomit blood.  Your swelling or pain is not relieved with medicines. MAKE SURE YOU:   Understand these instructions.  Will watch your condition.  Will get help right away if you are not doing well or get worse. Document Released: 12/09/2000 Document Revised: 12/09/2011 Document Reviewed: 09/07/2011 [facility] Patient Information 2015 [employer], [place]. This information is not intended to replace advice given to you by your health care provider. Make sure you discuss any questions you have with your health care provider.

## 2014-04-16 NOTE — ED Notes (Signed)
Patient reports fall on Wednesday night. Denies dizziness-tripped over roots in the yard. Reports landing on her left arm but is unsure if she "cracked her ribs on something." Having severe left lower rib pain under left breast. Denies chest pain. Took Advil 800 mg around 1400. No other complaints/concerns.

## 2014-04-16 NOTE — ED Notes (Signed)
Ambulated to the BR without assist.

## 2014-04-16 NOTE — ED Notes (Signed)
Pt reports L rib pain from fall Wednesday.  Reports unable to take a deep breath d/t pain.  Very mild bruising per pt.

## 2014-04-16 NOTE — ED Provider Notes (Signed)
CSN: [id number]     Arrival date & time 04/16/14  2008 History  This chart was scribed for non-physician practitioner, Antony Madura, PA-C working with Flint Melter, MD by Freida Busman, ED Scribe. This patient was seen in room WTR8/WTR8 and the patient's care was started at 10:33 PM.    Chief Complaint  Patient presents with  . Hip Pain  . Shoulder Pain  . Fall    The history is provided by the patient. No language interpreter was used.     HPI Comments:  Darlene Meyer is a 59 y.o. female who presents to the Emergency Department complaining of persistent pain under left breast/left ribs area s/p fall 6 days ago. She was walking and tripped over a root landing on her left side.  She denies head injury and LOC.  Her pain is  exacerbated when coughing, laughing and with deep breath and movement. She also reports associated left shoulder pain that has improved since onset. She has been taking  800 mg Advil three times a day since fall with mild relief. She denies fever, acute urinary/bowel incontinence, and numbness   Past Medical History  Diagnosis Date  . Thyroid disease   . Asthma   . Hypercholesteremia     "before gastric OR; not now" (07/01/2012)  . Hypertension     "before gastric OR; not now" (07/01/2012)  . CHF (congestive heart failure)     "before gastric OR; not now" (07/01/2012)  . Lung abnormality     "found spot on lung sometime last year; was to have this rechecked in 1 yr; haven't done that" (07/01/2012)  . Shortness of breath     "related to ?allergies" (07/01/2012)  . COPD (chronic obstructive pulmonary disease)   . Pacemaker     "before gastric OR; not now" (07/01/2012)  . GERD (gastroesophageal reflux disease)     "before gastric OR; not now" (07/01/2012)  . Migraines     "very rarely" (07/01/2012)  . Sensitiveness to light     "related to migraines" (07/01/2012)  . Arthritis     "little bit" (07/01/2012)  . Fibromyalgia     "3 of my sisters have it; I have alot of the same  symptoms" (07/01/2012)  . Chronic lower back pain   . Depression     "got off RX ~ 26yr ago" (07/01/2012)   Past Surgical History  Procedure Laterality Date  . 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 Thyroidectomy, partial  2000's    Tumor was benign  . Carpal tunnel release Bilateral 2000's  . Abdominal hysterectomy  1993  . Cesarean section  1977; 1979  . Appendectomy  02/1971   Family History  Problem Relation Age of Onset  . Cancer Mother   . Hyperlipidemia Mother   . Heart failure Mother    History  Substance Use Topics  . Smoking status: Current Every Day Smoker -- 0.33 packs/day for 30 years    Types: Cigarettes  . Smokeless tobacco: Never Used  . Alcohol Use: 0.6 oz/week    1 Glasses of wine per week     Comment: 07/01/2012 "once/month, 3-4 glasses wine"   OB History    No data available      Review of Systems  Constitutional: Negative for fever and chills.  Respiratory: Negative for shortness of breath.   Musculoskeletal: Positive for myalgias and arthralgias.  All other systems reviewed and are negative.   Allergies  Review of patient's allergies indicates no known  allergies.  Home Medications   Prior to Admission medications   Medication Sig Start Date End Date Taking? Authorizing Provider  aspirin 325 MG tablet Take 1 tablet (325 mg total) by mouth daily. 07/03/12  Yes Penny Piarlando Vega, MD  CALCIUM-VITAMIN D PO Take 1 tablet by mouth daily.   Yes Historical Provider, MD  cetirizine (ZYRTEC) 10 MG tablet Take 10 mg by mouth daily.     Yes Historical Provider, MD  diphenhydrAMINE (BENADRYL) 25 mg capsule Take 25 mg by mouth every 6 (six) hours as needed for allergies.   Yes Historical Provider, MD  ibuprofen (ADVIL,MOTRIN) 200 MG tablet Take 800 mg by mouth every 6 (six) hours as needed for mild pain or moderate pain.    Yes Historical Provider, MD  Multiple Vitamins-Minerals (MULTIVITAMINS THER. W/MINERALS) TABS Take 1 tablet by mouth daily.     Yes Historical  Provider, MD  acetaminophen (TYLENOL) 500 MG tablet Take 500 mg by mouth every 6 (six) hours as needed. For pain relief    Historical Provider, MD  hydrocortisone cream 1 % Apply 1 application topically as needed. For psoriasis.     Historical Provider, MD  oxyCODONE-acetaminophen (PERCOCET/ROXICET) 5-325 MG per tablet Take 1-2 tablets by mouth every 6 (six) hours as needed for moderate pain or severe pain. 04/16/14   Antony MaduraKelly Krystelle Prashad, PA-C   BP 146/66 mmHg  Pulse 71  Temp(Src) 98.4 F (36.9 C) (Oral)  Resp 22  SpO2 100%   Physical Exam  Constitutional: She is oriented to person, place, and time. She appears well-developed and well-nourished. No distress.  Nontoxic/nonseptic appearing.  HENT:  Head: Normocephalic and atraumatic.  Eyes: Conjunctivae and EOM are normal. No scleral icterus.  Neck: Normal range of motion.  Cardiovascular: Normal rate, regular rhythm and intact distal pulses.   Pulmonary/Chest: Effort normal. No respiratory distress. She has no wheezes. She has no rales. She exhibits tenderness. She exhibits no crepitus, no deformity and no swelling.    Respirations even and unlabored. Tenderness appreciated to the inferior anterior left chest wall without crepitus or deformity. This extends around to the patient's left anterior axillary line. No contusion or hematoma noted.  Abdominal: Soft. She exhibits no distension. There is no tenderness.  Soft, nontender  Musculoskeletal: Normal range of motion.       Left shoulder: She exhibits tenderness. She exhibits normal range of motion, no bony tenderness, no swelling, no crepitus, no deformity, no spasm and normal pulse.  Neurological: She is alert and oriented to person, place, and time. She exhibits normal muscle tone. Coordination normal.  GCS 15. Patient moves extremities without ataxia. She ambulates with normal gait.  Skin: Skin is warm and dry. No rash noted. She is not diaphoretic. No erythema. No pallor.  Psychiatric: She  has a normal mood and affect. Her behavior is normal.  Nursing note and vitals reviewed.   ED Course  Procedures   DIAGNOSTIC STUDIES:  Oxygen Saturation is 100% on RA, normal by my interpretation.    COORDINATION OF CARE:  10:38 PM Discussed treatment plan with pt at bedside and pt agreed to plan.  Labs Review Labs Reviewed - No data to display  Imaging Review Dg Ribs Unilateral W/chest Left  04/16/2014   CLINICAL DATA:  Pt states she tripped on tree root and fell on left side Wednesday. Pain under left breast anteriorly. BB placed.  EXAM: LEFT RIBS AND CHEST - 3+ VIEW  COMPARISON:  03/10/2013  FINDINGS: No rib fracture or rib lesion.  Cardiac silhouette mildly enlarged. Normal mediastinal and hilar contours. Clear lungs. No pleural effusion no pneumothorax.  IMPRESSION: No rib fracture or rib lesion.  No acute cardiopulmonary disease.   Electronically Signed   By: Amie Portland M.D.   On: 04/16/2014 20:40     EKG Interpretation None      MDM   Final diagnoses:  Chest wall contusion, left, initial encounter    59 year old well-appearing female presents to the emergency department for further evaluation of left sided chest wall pain after a fall. Palpable tenderness appreciated. No crepitus or deformity. X-ray reviewed by myself which shows no evidence of rib fracture. No evidence of pneumothorax. Patient has no tachypnea, dyspnea, or hypoxia. Patient to be treated for chest wall contusion with NSAIDs and Percocet for pain as needed. Patient given incentive spirometer with instruction to return if symptoms worsen. Patient agreeable to plan with no unaddressed concerns. Patient discharged in good condition.  I personally performed the services described in this documentation, which was scribed in my presence. The recorded information has been reviewed and is accurate.   Filed Vitals:   04/16/14 2016  BP: 146/66  Pulse: 71  Temp: 98.4 F (36.9 C)  TempSrc: Oral  Resp: 22   SpO2: 100%     Antony Madura, PA-C 04/16/14 2302  Flint Melter, MD 04/17/14 0005

## 2014-12-17 ENCOUNTER — Emergency Department (INDEPENDENT_AMBULATORY_CARE_PROVIDER_SITE_OTHER)
Admission: EM | Admit: 2014-12-17 | Discharge: 2014-12-17 | Disposition: A | Discharge status: Home / Self Care | Payer: Self-pay | Source: Home / Self Care

## 2014-12-17 ENCOUNTER — Encounter (HOSPITAL_COMMUNITY): Payer: Self-pay | Admitting: Emergency Medicine

## 2014-12-17 DIAGNOSIS — F329 Major depressive disorder, single episode, unspecified: Secondary | ICD-10-CM

## 2014-12-17 DIAGNOSIS — F32A Depression, unspecified: Secondary | ICD-10-CM

## 2014-12-17 DIAGNOSIS — R52 Pain, unspecified: Secondary | ICD-10-CM

## 2014-12-17 DIAGNOSIS — R5383 Other fatigue: Secondary | ICD-10-CM

## 2014-12-17 LAB — COMPREHENSIVE METABOLIC PANEL
ALBUMIN: 4 g/dL (ref 3.5–5.0)
ALT: 30 U/L (ref 14–54)
AST: 27 U/L (ref 15–41)
Alkaline Phosphatase: 88 U/L (ref 38–126)
Anion gap: 9 (ref 5–15)
BUN: 9 mg/dL (ref 6–20)
CHLORIDE: 106 mmol/L (ref 101–111)
CO2: 25 mmol/L (ref 22–32)
CREATININE: 0.62 mg/dL (ref 0.44–1.00)
Calcium: 10.6 mg/dL — ABNORMAL HIGH (ref 8.9–10.3)
GFR calc Af Amer: >60 mL/min (ref >60)
GFR calc non Af Amer: >60 mL/min (ref >60)
GLUCOSE: 118 mg/dL — AB (ref 65–99)
Potassium: 3.7 mmol/L (ref 3.5–5.1)
SODIUM: 140 mmol/L (ref 135–145)
Total Bilirubin: 0.6 mg/dL (ref 0.3–1.2)
Total Protein: 6.9 g/dL (ref 6.5–8.1)

## 2014-12-17 LAB — CBC WITH DIFFERENTIAL/PLATELET
Basophils Absolute: 0 10*3/uL (ref 0.0–0.1)
Basophils Relative: 0 %
EOS ABS: 0.4 10*3/uL (ref 0.0–0.7)
EOS PCT: 4 %
HCT: 44.7 % (ref 36.0–46.0)
Hemoglobin: 14.3 g/dL (ref 12.0–15.0)
LYMPHS ABS: 2.9 10*3/uL (ref 0.7–4.0)
Lymphocytes Relative: 30 %
MCH: 30 pg (ref 26.0–34.0)
MCHC: 32 g/dL (ref 30.0–36.0)
MCV: 93.7 fL (ref 78.0–100.0)
MONOS PCT: 5 %
Monocytes Absolute: 0.5 10*3/uL (ref 0.1–1.0)
Neutro Abs: 5.9 10*3/uL (ref 1.7–7.7)
Neutrophils Relative %: 61 %
PLATELETS: 218 10*3/uL (ref 150–400)
RBC: 4.77 MIL/uL (ref 3.87–5.11)
RDW: 13.2 % (ref 11.5–15.5)
WBC: 9.8 10*3/uL (ref 4.0–10.5)

## 2014-12-17 LAB — TSH: TSH: 1.673 u[IU]/mL (ref 0.350–4.500)

## 2014-12-17 LAB — VITAMIN B12: VITAMIN B 12: 716 pg/mL (ref 180–914)

## 2014-12-17 LAB — SEDIMENTATION RATE: SED RATE: 6 mm/h (ref 0–22)

## 2014-12-17 LAB — FOLATE: FOLATE: 22.9 ng/mL (ref >5.9)

## 2014-12-17 MED ORDER — METHOCARBAMOL 500 MG PO TABS: 500.0000 mg | ORAL_TABLET | Freq: Four times a day (QID) | ORAL | Status: DC | PRN

## 2014-12-17 MED ORDER — MELOXICAM 15 MG PO TABS: 7.5000 mg | ORAL_TABLET | Freq: Every day | ORAL | Status: DC

## 2014-12-17 NOTE — Discharge Instructions (Signed)
The cause of your symptoms is not immediately clear Please start the meloxicam for pain relief or use the ibuprofen 800 every 8 hours Please use the robaxin for additional muscle tension relief Please call Maggy, our social worker, for additional financial support Please establish further care at a new primary care office

## 2014-12-17 NOTE — ED Provider Notes (Signed)
CSN: [id number]     Arrival date & time 12/17/14  1332 History   None    Chief Complaint  Patient presents with  . Muscle Pain   (Consider location/radiation/quality/duration/timing/severity/associated sxs/prior Treatment) HPI  Generalized aches and pains and weakness. Ongoing for 1 year. Worse over the last 6 months. Patient states that she does sometimes get localized aches in her legs primarily calves bilaterally as well as upper back. States that she also feels like she is getting weaker and weaker over time and has less and less energy. Patient stays at home and takes care of her chronically ill bilateral amputee father. Patient has no regular doctor is not been to see a primary care physician in several years. She is status post hemithyroidectomy 15-20 years ago. Denies any vaginal bleeding, melena, hematochezia. Symptoms improved with rest. Symptoms are fairly constant but have a waxing and waning nature. Last cardiac echogram was in 2014 showing EF of 60-65% and no diastolic dysfunction.  Patient also is complaining of depression for which she said she took antidepressants many years ago but weaned off of the medicines and was doing well until last 6 months. Denies any SI/HI.  5-8 cig per day.    Past Medical History  Diagnosis Date  . Thyroid disease   . Asthma   . Hypercholesteremia     "before gastric OR; not now" (07/01/2012)  . Hypertension     "before gastric OR; not now" (07/01/2012)  . CHF (congestive heart failure)     "before gastric OR; not now" (07/01/2012)  . Lung abnormality     "found spot on lung sometime last year; was to have this rechecked in 1 yr; haven't done that" (07/01/2012)  . Shortness of breath     "related to ?allergies" (07/01/2012)  . COPD (chronic obstructive pulmonary disease)   . Pacemaker     "before gastric OR; not now" (07/01/2012)  . GERD (gastroesophageal reflux disease)     "before gastric OR; not now" (07/01/2012)  . Migraines     "very rarely"  (07/01/2012)  . Sensitiveness to light     "related to migraines" (07/01/2012)  . Arthritis     "little bit" (07/01/2012)  . Fibromyalgia     "3 of my sisters have it; I have alot of the same symptoms" (07/01/2012)  . Chronic lower back pain   . Depression     "got off RX ~ 3yr ago" (07/01/2012)   Past Surgical History  Procedure Laterality Date  . 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 Thyroidectomy, partial  2000's    Tumor was benign  . Carpal tunnel release Bilateral 2000's  . Abdominal hysterectomy  1993  . Cesarean section  1977; 1979  . Appendectomy  02/1971   Family History  Problem Relation Age of Onset  . Cancer Mother   . Hyperlipidemia Mother   . Heart failure Mother    Social History  Substance Use Topics  . Smoking status: Current Every Day Smoker -- 0.33 packs/day for 30 years    Types: Cigarettes  . Smokeless tobacco: Never Used  . Alcohol Use: 0.6 oz/week    1 Glasses of wine per week     Comment: 07/01/2012 "once/month, 3-4 glasses wine"   OB History    No data available     Review of Systems Per HPI with all other pertinent systems negative.   Allergies  Review of patient's allergies indicates no known allergies.  Home Medications   Prior  to Admission medications   Medication Sig Start Date End Date Taking? Authorizing Provider  acetaminophen (TYLENOL) 500 MG tablet Take 500 mg by mouth every 6 (six) hours as needed. For pain relief    Historical Provider, MD  aspirin 325 MG tablet Take 1 tablet (325 mg total) by mouth daily. 07/03/12   Penny Pia, MD  CALCIUM-VITAMIN D PO Take 1 tablet by mouth daily.    Historical Provider, MD  cetirizine (ZYRTEC) 10 MG tablet Take 10 mg by mouth daily.      Historical Provider, MD  diphenhydrAMINE (BENADRYL) 25 mg capsule Take 25 mg by mouth every 6 (six) hours as needed for allergies.    Historical Provider, MD  ibuprofen (ADVIL,MOTRIN) 200 MG tablet Take 800 mg by mouth every 6 (six) hours as needed for mild pain or  moderate pain.     Historical Provider, MD  meloxicam (MOBIC) 15 MG tablet Take 0.5-1 tablets (7.5-15 mg total) by mouth daily. 12/17/14   Ozella Rocks, MD  methocarbamol (ROBAXIN) 500 MG tablet Take 1-2 tablets (500-1,000 mg total) by mouth every 6 (six) hours as needed for muscle spasms. 12/17/14   Ozella Rocks, MD  Multiple Vitamins-Minerals (MULTIVITAMINS THER. W/MINERALS) TABS Take 1 tablet by mouth daily.      Historical Provider, MD  oxyCODONE-acetaminophen (PERCOCET/ROXICET) 5-325 MG per tablet Take 1-2 tablets by mouth every 6 (six) hours as needed for moderate pain or severe pain. 04/16/14   Antony Madura, PA-C   Meds Ordered and Administered this Visit  Medications - No data to display  BP 143/85 mmHg  Pulse 72  Temp(Src) 98 F (36.7 C) (Oral)  Resp 18  SpO2 98% No data found.   Physical Exam Physical Exam  Constitutional: oriented to person, place, and time. appears well-developed and well-nourished. No distress.  HENT:  Head: Normocephalic and atraumatic.  Eyes: EOMI. PERRL.  Neck: Normal range of motion.  Cardiovascular: RRR, no m/r/g, 2+ distal pulses,  Pulmonary/Chest: Effort normal and breath sounds normal. No respiratory distress.  Abdominal: Soft. Bowel sounds are normal. NonTTP, no distension.  Musculoskeletal: Normal range of motion. Non ttp, no effusion.  Neurological: alert and oriented to person, place, and time.  Skin: Skin is warm. No rash noted. non diaphoretic.  Psychiatric: normal mood and affect. behavior is normal. Judgment and thought content normal.   ED Course  Procedures (including critical care time)  Labs Review Labs Reviewed  COMPREHENSIVE METABOLIC PANEL  CBC WITH DIFFERENTIAL/PLATELET  VITAMIN B12  FOLATE  SEDIMENTATION RATE  TSH    Imaging Review No results found.   Visual Acuity Review  Right Eye Distance:   Left Eye Distance:   Bilateral Distance:    Right Eye Near:   Left Eye Near:    Bilateral Near:          MDM   1. Other fatigue   2. Generalized pain   3. Depression    Tobacco cessation counseling provided [place] patient to seek further medical coverage and regular routine care through a primary care physician. Provided social work contact information for patient to help her in this effort. Etiology of her symptoms could be multifactorial and complex including psychosomatic, anemia, metabolic dysfunction, hormone imbalance such as low thyroid. Numerous labs drawn today after discussion with patient and will follow-up with the patient regarding these results. Patient understands that she may need further treatment and/or lab work through her primary care physician's office. We are unable to start an antidepressant at  this point time as this requires routine regular follow-up and were unable to provide the monitoring needed for this. Patient understands this. Patient is not suicidal or homicidal at this point time.    Ozella Rocks, MD 12/17/14 [phone]

## 2014-12-17 NOTE — ED Notes (Signed)
C/o muscle pain States she has muscle pain and spasms for 6 months now States she is weak Advil, tylenol and rest as tx

## 2014-12-21 NOTE — ED Notes (Signed)
Discussed lab reports w patient . Will see about getting a PCP to further investigate her pain and fatigue issues

## 2015-06-02 ENCOUNTER — Encounter (HOSPITAL_COMMUNITY): Payer: Self-pay | Admitting: Family Medicine

## 2015-06-02 ENCOUNTER — Emergency Department (HOSPITAL_COMMUNITY)
Admission: EM | Admit: 2015-06-02 | Discharge: 2015-06-03 | Disposition: A | Discharge status: Home / Self Care | Payer: Self-pay | Attending: Emergency Medicine | Admitting: Emergency Medicine

## 2015-06-02 DIAGNOSIS — M797 Fibromyalgia: Secondary | ICD-10-CM | POA: Insufficient documentation

## 2015-06-02 DIAGNOSIS — I509 Heart failure, unspecified: Secondary | ICD-10-CM | POA: Insufficient documentation

## 2015-06-02 DIAGNOSIS — H6121 Impacted cerumen, right ear: Secondary | ICD-10-CM | POA: Insufficient documentation

## 2015-06-02 DIAGNOSIS — Z950 Presence of cardiac pacemaker: Secondary | ICD-10-CM | POA: Insufficient documentation

## 2015-06-02 DIAGNOSIS — Z8639 Personal history of other endocrine, nutritional and metabolic disease: Secondary | ICD-10-CM | POA: Insufficient documentation

## 2015-06-02 DIAGNOSIS — H65191 Other acute nonsuppurative otitis media, right ear: Secondary | ICD-10-CM | POA: Insufficient documentation

## 2015-06-02 DIAGNOSIS — I10 Essential (primary) hypertension: Secondary | ICD-10-CM | POA: Insufficient documentation

## 2015-06-02 DIAGNOSIS — J449 Chronic obstructive pulmonary disease, unspecified: Secondary | ICD-10-CM | POA: Insufficient documentation

## 2015-06-02 DIAGNOSIS — Z7982 Long term (current) use of aspirin: Secondary | ICD-10-CM | POA: Insufficient documentation

## 2015-06-02 DIAGNOSIS — G8929 Other chronic pain: Secondary | ICD-10-CM | POA: Insufficient documentation

## 2015-06-02 DIAGNOSIS — Z791 Long term (current) use of non-steroidal anti-inflammatories (NSAID): Secondary | ICD-10-CM | POA: Insufficient documentation

## 2015-06-02 DIAGNOSIS — M1990 Unspecified osteoarthritis, unspecified site: Secondary | ICD-10-CM | POA: Insufficient documentation

## 2015-06-02 DIAGNOSIS — Z8659 Personal history of other mental and behavioral disorders: Secondary | ICD-10-CM | POA: Insufficient documentation

## 2015-06-02 DIAGNOSIS — F17210 Nicotine dependence, cigarettes, uncomplicated: Secondary | ICD-10-CM | POA: Insufficient documentation

## 2015-06-02 DIAGNOSIS — Z79899 Other long term (current) drug therapy: Secondary | ICD-10-CM | POA: Insufficient documentation

## 2015-06-02 NOTE — ED Notes (Signed)
Patient reports she has some ear wax or congestion in her right ear. She attempted to use a Q-tip to clean her ear. After using the Q-tip, her ear started to hurt. Pt's friend bought over a "ear vacuum" where she flushed her ear out with sterile water and tried to vacuum out the congestion. Pt states after all that, she could not ear at all. 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

## 2015-06-03 MED ORDER — AMOXICILLIN 500 MG PO CAPS: 1000.0000 mg | ORAL_CAPSULE | Freq: Two times a day (BID) | ORAL | Status: DC

## 2015-06-03 MED ORDER — AMOXICILLIN 500 MG PO CAPS
1000.0000 mg | ORAL_CAPSULE | Freq: Once | ORAL | Status: AC
  Administered 2015-06-03: 1000 mg via ORAL
  Filled 2015-06-03: qty 2

## 2015-06-03 NOTE — Discharge Instructions (Signed)
Otitis Media, Adult °Otitis media is redness, soreness, and inflammation of the middle ear. Otitis media may be caused by allergies or, most commonly, by infection. Often it occurs as a complication of the common cold. °SIGNS AND SYMPTOMS °Symptoms of otitis media may include: °· Earache. °· Fever. °· Ringing in your ear. °· Headache. °· Leakage of fluid from the ear. °DIAGNOSIS °To diagnose otitis media, your health care provider will examine your ear with an otoscope. This is an instrument that allows your health care provider to see into your ear in order to examine your eardrum. Your health care provider also will ask you questions about your symptoms. °TREATMENT  °Typically, otitis media resolves on its own within 3-5 days. Your health care provider may prescribe medicine to ease your symptoms of pain. If otitis media does not resolve within 5 days or is recurrent, your health care provider may prescribe antibiotic medicines if he or she suspects that a bacterial infection is the cause. °HOME CARE INSTRUCTIONS  °· If you were prescribed an antibiotic medicine, finish it all even if you start to feel better. °· Take medicines only as directed by your health care provider. °· Keep all follow-up visits as directed by your health care provider. °SEEK MEDICAL CARE IF: °· You have otitis media only in one ear, or bleeding from your nose, or both. °· You notice a lump on your neck. °· You are not getting better in 3-5 days. °· You feel worse instead of better. °SEEK IMMEDIATE MEDICAL CARE IF:  °· You have pain that is not controlled with medicine. °· You have swelling, redness, or pain around your ear or stiffness in your neck. °· You notice that part of your face is paralyzed. °· You notice that the bone behind your ear (mastoid) is tender when you touch it. °MAKE SURE YOU:  °· Understand these instructions. °· Will watch your condition. °· Will get help right away if you are not doing well or get worse. °  °This  information is not intended to replace advice given to you by your health care provider. Make sure you discuss any questions you have with your health care provider. °  °Document Released: 12/20/2003 Document Revised: 04/06/2014 Document Reviewed: 10/11/2012 °Elsevier Interactive Patient Education ©2016 [employer] ° °Cerumen Impaction °The structures of the external ear canal secrete a waxy substance known as cerumen. Excess cerumen can build up in the ear canal, causing a condition known as cerumen impaction. Cerumen impaction can cause ear pain and disrupt the function of the ear. °The rate of cerumen production differs for each individual. In certain individuals, the configuration of the ear canal may decrease his or her ability to naturally remove cerumen. °CAUSES °Cerumen impaction is caused by excessive cerumen production or buildup. °RISK FACTORS °· Frequent use of swabs to clean ears. °· Having narrow ear canals. °· Having eczema. °· Being dehydrated. °SIGNS AND SYMPTOMS °· Diminished hearing. °· Ear drainage. °· Ear pain. °· Ear itch. °TREATMENT °Treatment may involve: °· Over-the-counter or prescription ear drops to soften the cerumen. °· Removal of cerumen by a health care provider. This may be done with: °¨ Irrigation with warm water. This is the most common method of removal. °¨ Ear curettes and other instruments. °¨ Surgery. This may be done in severe cases. °HOME CARE INSTRUCTIONS °· Take medicines only as directed by your health care provider. °· Do not insert objects into the ear with the intent of cleaning the ear. °PREVENTION °·   Do not insert objects into the ear, even with the intent of cleaning the ear. Removing cerumen as a part of normal hygiene is not necessary, and the use of swabs in the ear canal is not recommended. °· Drink enough water to keep your urine clear or pale yellow. °· Control your eczema if you have it. °SEEK MEDICAL CARE IF: °· You develop ear pain. °· You develop bleeding  from the ear. °· The cerumen does not clear after you use ear drops as directed. °  °This information is not intended to replace advice given to you by your health care provider. Make sure you discuss any questions you have with your health care provider. °  °Document Released: 04/23/2004 Document Revised: 04/06/2014 Document Reviewed: 10/31/2014 °Elsevier Interactive Patient Education ©2016 [employer] ° °

## 2015-06-07 NOTE — ED Provider Notes (Signed)
CSN: [id number]     Arrival date & time 06/02/15  2344 History   First MD Initiated Contact with Patient 06/03/15 0028     Chief Complaint  Patient presents with  . Ear Injury     (Consider location/radiation/quality/duration/timing/severity/associated sxs/prior Treatment) HPI Comments: Presents with right ear pain and hearing changes without drainage, bleeding or fever. She reports she attempted to clean her ear with a Q-tip then a "ear vacuum", and had sudden onset of worsening pain and a significant decrease in hearing. No significant congestion, sore throat or sinus pressure.   The history is provided by the patient. No language interpreter was used.    Past Medical History  Diagnosis Date  . Thyroid disease   . Asthma   . Hypercholesteremia     "before gastric OR; not now" (07/01/2012)  . Hypertension     "before gastric OR; not now" (07/01/2012)  . CHF (congestive heart failure) (HCC)     "before gastric OR; not now" (07/01/2012)  . Lung abnormality     "found spot on lung sometime last year; was to have this rechecked in 1 yr; haven't done that" (07/01/2012)  . Shortness of breath     "related to ?allergies" (07/01/2012)  . COPD (chronic obstructive pulmonary disease) (HCC)   . Pacemaker     "before gastric OR; not now" (07/01/2012)  . GERD (gastroesophageal reflux disease)     "before gastric OR; not now" (07/01/2012)  . Migraines     "very rarely" (07/01/2012)  . Sensitiveness to light     "related to migraines" (07/01/2012)  . Arthritis     "little bit" (07/01/2012)  . Fibromyalgia     "3 of my sisters have it; I have alot of the same symptoms" (07/01/2012)  . Chronic lower back pain   . Depression     "got off RX ~ 1yr ago" (07/01/2012)   Past Surgical History  Procedure Laterality Date  . 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 Thyroidectomy, partial  2000's    Tumor was benign  . Carpal tunnel release Bilateral 2000's  . Abdominal hysterectomy  1993  . Cesarean section  1977;  1979  . Appendectomy  02/1971   Family History  Problem Relation Age of Onset  . Cancer Mother   . Hyperlipidemia Mother   . Heart failure Mother    Social History  Substance Use Topics  . Smoking status: Current Every Day Smoker -- 0.33 packs/day for 30 years    Types: Cigarettes  . Smokeless tobacco: Never Used  . Alcohol Use: 0.6 oz/week    1 Glasses of wine per week     Comment: Once every month or two.    OB History    No data available     Review of Systems  Constitutional: Negative for fever.  HENT: Positive for ear pain and hearing loss. Negative for congestion, sore throat and trouble swallowing.   Respiratory: Negative for cough.   Gastrointestinal: Negative for nausea.  Musculoskeletal: Negative for myalgias.  Neurological: Negative for headaches.      Allergies  Review of patient's allergies indicates no known allergies.  Home Medications   Prior to Admission medications   Medication Sig Start Date End Date Taking? Authorizing Provider  acetaminophen (TYLENOL) 500 MG tablet Take 500 mg by mouth every 6 (six) hours as needed. For pain relief    Historical Provider, MD  amoxicillin (AMOXIL) 500 MG capsule Take 2 capsules (1,000 mg total) by mouth  2 (two) times daily. 06/03/15   Elpidio AnisShari Lacey Wallman, PA-C  aspirin 325 MG tablet Take 1 tablet (325 mg total) by mouth daily. 07/03/12   Penny Piarlando Vega, MD  CALCIUM-VITAMIN D PO Take 1 tablet by mouth daily.    Historical Provider, MD  cetirizine (ZYRTEC) 10 MG tablet Take 10 mg by mouth daily.      Historical Provider, MD  diphenhydrAMINE (BENADRYL) 25 mg capsule Take 25 mg by mouth every 6 (six) hours as needed for allergies.    Historical Provider, MD  ibuprofen (ADVIL,MOTRIN) 200 MG tablet Take 800 mg by mouth every 6 (six) hours as needed for mild pain or moderate pain.     Historical Provider, MD  meloxicam (MOBIC) 15 MG tablet Take 0.5-1 tablets (7.5-15 mg total) by mouth daily. 12/17/14   Ozella Rocksavid J Merrell, MD   methocarbamol (ROBAXIN) 500 MG tablet Take 1-2 tablets (500-1,000 mg total) by mouth every 6 (six) hours as needed for muscle spasms. 12/17/14   Ozella Rocksavid J Merrell, MD  Multiple Vitamins-Minerals (MULTIVITAMINS THER. W/MINERALS) TABS Take 1 tablet by mouth daily.      Historical Provider, MD  oxyCODONE-acetaminophen (PERCOCET/ROXICET) 5-325 MG per tablet Take 1-2 tablets by mouth every 6 (six) hours as needed for moderate pain or severe pain. 04/16/14   Antony MaduraKelly Humes, PA-C   BP 138/91 mmHg  Pulse 60  Temp(Src) 97.5 F (36.4 C) (Oral)  Resp 20  Ht 5\' 3"  (1.6 m)  Wt 95.255 kg  BMI 37.21 kg/m2  SpO2 99% Physical Exam  Constitutional: She appears well-developed and well-nourished. No distress.  HENT:  Left Ear: Tympanic membrane normal.  Mouth/Throat: Oropharynx is clear and moist.  Right ear canal occluded with ear wax. No blood in canal. No pain with external ear movement.   Eyes: Conjunctivae are normal.  Neck: Normal range of motion. Neck supple.  Pulmonary/Chest: Effort normal.  Lymphadenopathy:    She has no cervical adenopathy.    ED Course  Procedures (including critical care time) Labs Review Labs Reviewed - No data to display  Imaging Review No results found. I have personally reviewed and evaluated these images and lab results as part of my medical decision-making.   EKG Interpretation None      MDM   Final diagnoses:  Other acute nonsuppurative otitis media of right ear  Cerumen impaction, right    Right ear lavaged with clearance of cerumen. She reports less but still present pain, and improvement in hearing to baseline normal. Re-examination shows dull, markedly red TM without effusion.  Will treat with abx and encourage follow up with PCP for recheck.     Elpidio AnisShari Mumtaz Lovins, PA-C 06/07/15 0012  Arby BarretteMarcy Pfeiffer, MD 06/12/15 [id number]

## 2015-08-22 ENCOUNTER — Emergency Department (HOSPITAL_COMMUNITY): Payer: Self-pay

## 2015-08-22 ENCOUNTER — Emergency Department (HOSPITAL_COMMUNITY)
Admission: EM | Admit: 2015-08-22 | Discharge: 2015-08-22 | Disposition: A | Discharge status: Home / Self Care | Payer: Self-pay | Attending: Emergency Medicine | Admitting: Emergency Medicine

## 2015-08-22 ENCOUNTER — Encounter (HOSPITAL_COMMUNITY): Payer: Self-pay

## 2015-08-22 DIAGNOSIS — F17210 Nicotine dependence, cigarettes, uncomplicated: Secondary | ICD-10-CM | POA: Insufficient documentation

## 2015-08-22 DIAGNOSIS — I110 Hypertensive heart disease with heart failure: Secondary | ICD-10-CM | POA: Insufficient documentation

## 2015-08-22 DIAGNOSIS — E7800 Pure hypercholesterolemia, unspecified: Secondary | ICD-10-CM | POA: Insufficient documentation

## 2015-08-22 DIAGNOSIS — J449 Chronic obstructive pulmonary disease, unspecified: Secondary | ICD-10-CM | POA: Insufficient documentation

## 2015-08-22 DIAGNOSIS — I509 Heart failure, unspecified: Secondary | ICD-10-CM | POA: Insufficient documentation

## 2015-08-22 DIAGNOSIS — M5416 Radiculopathy, lumbar region: Secondary | ICD-10-CM | POA: Insufficient documentation

## 2015-08-22 DIAGNOSIS — Z791 Long term (current) use of non-steroidal anti-inflammatories (NSAID): Secondary | ICD-10-CM | POA: Insufficient documentation

## 2015-08-22 DIAGNOSIS — M79604 Pain in right leg: Secondary | ICD-10-CM | POA: Insufficient documentation

## 2015-08-22 DIAGNOSIS — M1990 Unspecified osteoarthritis, unspecified site: Secondary | ICD-10-CM | POA: Insufficient documentation

## 2015-08-22 DIAGNOSIS — F329 Major depressive disorder, single episode, unspecified: Secondary | ICD-10-CM | POA: Insufficient documentation

## 2015-08-22 DIAGNOSIS — M5410 Radiculopathy, site unspecified: Secondary | ICD-10-CM

## 2015-08-22 DIAGNOSIS — Z950 Presence of cardiac pacemaker: Secondary | ICD-10-CM | POA: Insufficient documentation

## 2015-08-22 DIAGNOSIS — Z7982 Long term (current) use of aspirin: Secondary | ICD-10-CM | POA: Insufficient documentation

## 2015-08-22 DIAGNOSIS — M546 Pain in thoracic spine: Secondary | ICD-10-CM | POA: Insufficient documentation

## 2015-08-22 MED ORDER — METHOCARBAMOL 500 MG PO TABS: 500.0000 mg | ORAL_TABLET | Freq: Four times a day (QID) | ORAL | Status: DC | PRN

## 2015-08-22 MED ORDER — OXYCODONE-ACETAMINOPHEN 5-325 MG PO TABS
1.0000 | ORAL_TABLET | Freq: Once | ORAL | Status: AC
  Administered 2015-08-22: 1 via ORAL
  Filled 2015-08-22: qty 1

## 2015-08-22 MED ORDER — PREDNISONE 20 MG PO TABS: ORAL_TABLET | ORAL | Status: DC

## 2015-08-22 MED ORDER — TRAMADOL HCL 50 MG PO TABS: 50.0000 mg | ORAL_TABLET | Freq: Four times a day (QID) | ORAL | Status: DC | PRN

## 2015-08-22 NOTE — ED Provider Notes (Signed)
CSN: [id number]     Arrival date & time 08/22/15  1413 History   First MD Initiated Contact with Patient 08/22/15 1430     Chief Complaint  Patient presents with  . Back Pain  . Tingling     (Consider location/radiation/quality/duration/timing/severity/associated sxs/prior Treatment) HPI   60 year old female with history of fibromyalgia, chronic low back pain, arthritis, COPD, depression presenting with complaints of back pain. 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 A month ago she was picking up her father and she felt a pop in her mid to low back and has developing back pain since. Pain initially was intense but seems to improving up until 5 days ago when the pain became more intense. She described pain as a sharp stabbing sensation radiates to her right leg worsened with prolonged standing. She has been taking ibuprofen and Tylenol round-the-clock without adequate improvement of her symptoms. She is now unable to walk and uses a wheelchair to move around. She does have an appointment with the free clinic coming up but she is unable to tolerates the pain. She mentioned having a history of urinary stress incontinence which has not worsened. No history of IV drug use or active cancer. She did mention when having urge to have a bowel movement she can only hold for approximately 3-4 second before having a BM. No saddle anesthesia. No complaint of fever or chills or dysuria.      Past Medical History  Diagnosis Date  . Thyroid disease   . Asthma   . Hypercholesteremia     "before gastric OR; not now" (07/01/2012)  . Hypertension     "before gastric OR; not now" (07/01/2012)  . CHF (congestive heart failure) (HCC)     "before gastric OR; not now" (07/01/2012)  . Lung abnormality     "found spot on lung sometime last year; was to have this rechecked in 1 yr; haven't done that" (07/01/2012)  . Shortness of breath     "related to ?allergies" (07/01/2012)  . COPD  (chronic obstructive pulmonary disease) (HCC)   . Pacemaker     "before gastric OR; not now" (07/01/2012)  . GERD (gastroesophageal reflux disease)     "before gastric OR; not now" (07/01/2012)  . Migraines     "very rarely" (07/01/2012)  . Sensitiveness to light     "related to migraines" (07/01/2012)  . Arthritis     "little bit" (07/01/2012)  . Fibromyalgia     "3 of my sisters have it; I have alot of the same symptoms" (07/01/2012)  . Chronic lower back pain   . Depression     "got off RX ~ 65yr ago" (07/01/2012)   Past Surgical History  Procedure Laterality Date  . 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 Thyroidectomy, partial  2000's    Tumor was benign  . Carpal tunnel release Bilateral 2000's  . Abdominal hysterectomy  1993  . Cesarean section  1977; 1979  . Appendectomy  02/1971   Family History  Problem Relation Age of Onset  . Cancer Mother   . Hyperlipidemia Mother   . Heart failure Mother    Social History  Substance Use Topics  . Smoking status: Current Every Day Smoker -- 0.50 packs/day for 30 years    Types: Cigarettes  . Smokeless tobacco: Never Used  . Alcohol Use: 0.6 oz/week    1 Glasses of wine per week     Comment: Once every month  or two.    OB History    No data available     Review of Systems  Constitutional: Negative for fever.  Musculoskeletal: Positive for back pain.  Skin: Negative for rash and wound.  Neurological: Negative for numbness.      Allergies  Review of patient's allergies indicates no known allergies.  Home Medications   Prior to Admission medications   Medication Sig Start Date End Date Taking? Authorizing Provider  acetaminophen (TYLENOL) 500 MG tablet Take 500 mg by mouth every 6 (six) hours as needed. For pain relief    Historical Provider, MD  amoxicillin (AMOXIL) 500 MG capsule Take 2 capsules (1,000 mg total) by mouth 2 (two) times daily. 06/03/15   Elpidio Anis, PA-C  aspirin 325 MG tablet Take 1 tablet (325 mg total) by  mouth daily. 07/03/12   Penny Pia, MD  CALCIUM-VITAMIN D PO Take 1 tablet by mouth daily.    Historical Provider, MD  cetirizine (ZYRTEC) 10 MG tablet Take 10 mg by mouth daily.      Historical Provider, MD  diphenhydrAMINE (BENADRYL) 25 mg capsule Take 25 mg by mouth every 6 (six) hours as needed for allergies.    Historical Provider, MD  ibuprofen (ADVIL,MOTRIN) 200 MG tablet Take 800 mg by mouth every 6 (six) hours as needed for mild pain or moderate pain.     Historical Provider, MD  meloxicam (MOBIC) 15 MG tablet Take 0.5-1 tablets (7.5-15 mg total) by mouth daily. 12/17/14   Ozella Rocks, MD  methocarbamol (ROBAXIN) 500 MG tablet Take 1-2 tablets (500-1,000 mg total) by mouth every 6 (six) hours as needed for muscle spasms. 12/17/14   Ozella Rocks, MD  Multiple Vitamins-Minerals (MULTIVITAMINS THER. W/MINERALS) TABS Take 1 tablet by mouth daily.      Historical Provider, MD  oxyCODONE-acetaminophen (PERCOCET/ROXICET) 5-325 MG per tablet Take 1-2 tablets by mouth every 6 (six) hours as needed for moderate pain or severe pain. 04/16/14   Antony Madura, PA-C   BP 101/78 mmHg  Pulse 76  Temp(Src) 98.8 F (37.1 C) (Oral)  Resp 14  SpO2 100% Physical Exam  Constitutional: She appears well-developed and well-nourished. No distress.  Obese Caucasian female sitting in bed in no acute discomfort nontoxic in appearance  HENT:  Head: Atraumatic.  Eyes: Conjunctivae are normal.  Neck: Neck supple.  Cardiovascular: Normal rate and regular rhythm.   Pulmonary/Chest: Effort normal and breath sounds normal.  Genitourinary:  Patient has normal rectal tone, no mass, normal colored stool. Chaperone present during exam.  Musculoskeletal: She exhibits tenderness (Tenderness along thoracic and lumbar midline spine on palpation with paraspinal tenderness favoring the right side. No crepitus or step-off. Normal skin appearance. Normal range of motion.).  Positive right straight leg raise, right patella  reflex is mildly decreased as compared to left. Normal dorsal flexions of great toes. Equal strength to bilateral lower extremities. Able to ambulate with poor effort.  Neurological: She is alert.  Skin: No rash noted.  Psychiatric: She has a normal mood and affect.  Nursing note and vitals reviewed.   ED Course  Procedures (including critical care time) Labs Review Labs Reviewed - No data to display  Imaging Review Dg Thoracic Spine 2 View  08/22/2015  CLINICAL DATA:  Lower back pain, upper back pain for about 1 month, right rib pain EXAM: THORACIC SPINE 2 VIEWS COMPARISON:  04/13/2014 FINDINGS: Three views of thoracic spine submitted. Mild mid thoracic dextroscoliosis. No acute fracture or subluxation. Alignment, disc  spaces and vertebral body heights are preserved. IMPRESSION: Negative. Electronically Signed   By: Natasha MeadLiviu  Pop M.D.   On: 08/22/2015 15:48   Dg Lumbar Spine Complete  08/22/2015  CLINICAL DATA:  Upper and lower back pain on RIGHT and down center a back for 1 month EXAM: LUMBAR SPINE - COMPLETE 4+ VIEW COMPARISON:  None; correlation CT abdomen and pelvis 06/21/2011 FINDINGS: Hypoplastic last rib pair. Five non-rib-bearing lumbar vertebra. Levoconvex thoracolumbar scoliosis apex L2. Disc space narrowing L4-L5 with mild endplate spur formation. Mild facet degenerative changes lower lumbar spine. Vertebral body heights maintained without fracture or subluxation. No bone destruction or spondylolysis. SI joints preserved. IMPRESSION: Osseous demineralization with degenerative changes at lower lumbar spine and levoconvex scoliosis. No acute abnormalities. Electronically Signed   By: Ulyses SouthwardMark  Boles M.D.   On: 08/22/2015 15:49   I have personally reviewed and evaluated these images and lab results as part of my medical decision-making.   EKG Interpretation None      MDM   Final diagnoses:  Radicular low back pain    BP 162/70 mmHg  Pulse 76  Temp(Src) 97.9 F (36.6 C) (Oral)   Resp 18  SpO2 96%   3:19 PM Patient with history of chronic low back pain here with acute on chronic back pain with radicular pain. She is neurovascularly intact and able to ambulate. X-ray order  4:50 PM T-spine x-ray is negative. L-spine x-ray showing degenerative change of the lower lumbar spine with levoconvex scoliosis but no acute abnormalities. No evidence of compression fracture. Patient felt reassured. Encouraged gentle massage, walking and exercise, and patient will be discharged with a short course of steroid, muscle relaxant, and Ultram to use as needed. Outpatient follow-up recommended. Return precaution discussed.  Fayrene HelperBowie Evanne Matsunaga, PA-C 08/22/15 1650  Rolan BuccoMelanie Belfi, MD 08/22/15 [phone]

## 2015-08-22 NOTE — Discharge Instructions (Signed)
Radicular Pain °Radicular pain in either the arm or leg is usually from a bulging or herniated disk in the spine. A piece of the herniated disk may press against the nerves as the nerves exit the spine. This causes pain which is felt at the tips of the nerves down the arm or leg. Other causes of radicular pain may include: °· Fractures. °· Heart disease. °· Cancer. °· An abnormal and usually degenerative state of the nervous system or nerves (neuropathy). °Diagnosis may require CT or MRI scanning to determine the primary cause.  °Nerves that start at the neck (nerve roots) may cause radicular pain in the outer shoulder and arm. It can spread down to the thumb and fingers. The symptoms vary depending on which nerve root has been affected. In most cases radicular pain improves with conservative treatment. Neck problems may require physical therapy, a neck collar, or cervical traction. Treatment may take many weeks, and surgery may be considered if the symptoms do not improve.  °Conservative treatment is also recommended for sciatica. Sciatica causes pain to radiate from the lower back or buttock area down the leg into the foot. Often there is a history of back problems. Most patients with sciatica are better after 2 to 4 weeks of rest and other supportive care. Short term bed rest can reduce the disk pressure considerably. Sitting, however, is not a good position since this increases the pressure on the disk. You should avoid bending, lifting, and all other activities which make the problem worse. Traction can be used in severe cases. Surgery is usually reserved for patients who do not improve within the first months of treatment. °Only take over-the-counter or prescription medicines for pain, discomfort, or fever as directed by your caregiver. Narcotics and muscle relaxants may help by relieving more severe pain and spasm and by providing mild sedation. Cold or massage can give significant relief. Spinal manipulation  is not recommended. It can increase the degree of disc protrusion. Epidural steroid injections are often effective treatment for radicular pain. These injections deliver medicine to the spinal nerve in the space between the protective covering of the spinal cord and back bones (vertebrae). Your caregiver can give you more information about steroid injections. These injections are most effective when given within two weeks of the onset of pain.  °You should see your caregiver for follow up care as recommended. A program for neck and back injury rehabilitation with stretching and strengthening exercises is an important part of management.  °SEEK IMMEDIATE MEDICAL CARE IF: °· You develop increased pain, weakness, or numbness in your arm or leg. °· You develop difficulty with bladder or bowel control. °· You develop abdominal pain. °  °This information is not intended to replace advice given to you by your health care provider. Make sure you discuss any questions you have with your health care provider. °  °Document Released: 04/23/2004 Document Revised: 04/06/2014 Document Reviewed: 10/10/2014 °Elsevier Interactive Patient Education ©2016 [employer]. ° °

## 2015-08-22 NOTE — ED Notes (Signed)
Patient transported to X-ray 

## 2015-08-22 NOTE — ED Notes (Signed)
Pt c/o increasing generalized back pain and BLE tingling w/ standing x 1 month.  Pain score 9/10.  Pt reports taking ibuprofen w/ some relief.  Pt reports injuring back while trying to move father from a bed to wheelchair.  Hx of herniated disc.  Pt currently utilizing a wheelchair.

## 2015-08-22 NOTE — ED Notes (Signed)
PA at bedside.

## 2019-03-28 ENCOUNTER — Other Ambulatory Visit: Payer: Self-pay | Admitting: Physician Assistant

## 2019-03-28 DIAGNOSIS — E2839 Other primary ovarian failure: Secondary | ICD-10-CM

## 2019-08-28 ENCOUNTER — Emergency Department (HOSPITAL_COMMUNITY): Payer: Medicare Other

## 2019-08-28 ENCOUNTER — Emergency Department (HOSPITAL_COMMUNITY)
Admission: EM | Admit: 2019-08-28 | Discharge: 2019-08-28 | Disposition: A | Discharge status: Home / Self Care | Payer: Medicare Other | Attending: Emergency Medicine | Admitting: Emergency Medicine

## 2019-08-28 ENCOUNTER — Other Ambulatory Visit: Payer: Self-pay

## 2019-08-28 ENCOUNTER — Encounter (HOSPITAL_COMMUNITY): Payer: Self-pay | Admitting: Emergency Medicine

## 2019-08-28 ENCOUNTER — Other Ambulatory Visit: Payer: Self-pay | Admitting: Unknown Physician Specialty

## 2019-08-28 ENCOUNTER — Telehealth: Payer: Self-pay | Admitting: Unknown Physician Specialty

## 2019-08-28 DIAGNOSIS — I509 Heart failure, unspecified: Secondary | ICD-10-CM | POA: Diagnosis not present

## 2019-08-28 DIAGNOSIS — J45909 Unspecified asthma, uncomplicated: Secondary | ICD-10-CM | POA: Insufficient documentation

## 2019-08-28 DIAGNOSIS — U071 COVID-19: Secondary | ICD-10-CM

## 2019-08-28 DIAGNOSIS — Z950 Presence of cardiac pacemaker: Secondary | ICD-10-CM | POA: Diagnosis not present

## 2019-08-28 DIAGNOSIS — I110 Hypertensive heart disease with heart failure: Secondary | ICD-10-CM | POA: Insufficient documentation

## 2019-08-28 DIAGNOSIS — G459 Transient cerebral ischemic attack, unspecified: Secondary | ICD-10-CM

## 2019-08-28 DIAGNOSIS — F17210 Nicotine dependence, cigarettes, uncomplicated: Secondary | ICD-10-CM | POA: Insufficient documentation

## 2019-08-28 DIAGNOSIS — R0600 Dyspnea, unspecified: Secondary | ICD-10-CM | POA: Diagnosis present

## 2019-08-28 DIAGNOSIS — R0609 Other forms of dyspnea: Secondary | ICD-10-CM

## 2019-08-28 LAB — SARS CORONAVIRUS 2 BY RT PCR (HOSPITAL ORDER, PERFORMED IN ~~LOC~~ HOSPITAL LAB): SARS Coronavirus 2: POSITIVE — AB

## 2019-08-28 LAB — COMPREHENSIVE METABOLIC PANEL
ALT: 31 U/L (ref 0–44)
AST: 26 U/L (ref 15–41)
Albumin: 3.8 g/dL (ref 3.5–5.0)
Alkaline Phosphatase: 90 U/L (ref 38–126)
Anion gap: 10 (ref 5–15)
BUN: 15 mg/dL (ref 8–23)
CO2: 24 mmol/L (ref 22–32)
Calcium: 9.4 mg/dL (ref 8.9–10.3)
Chloride: 103 mmol/L (ref 98–111)
Creatinine, Ser: 0.8 mg/dL (ref 0.44–1.00)
GFR calc Af Amer: >60 mL/min (ref >60)
GFR calc non Af Amer: >60 mL/min (ref >60)
Glucose, Bld: 134 mg/dL — ABNORMAL HIGH (ref 70–99)
Potassium: 4.3 mmol/L (ref 3.5–5.1)
Sodium: 137 mmol/L (ref 135–145)
Total Bilirubin: 0.5 mg/dL (ref 0.3–1.2)
Total Protein: 6.8 g/dL (ref 6.5–8.1)

## 2019-08-28 LAB — CBC WITH DIFFERENTIAL/PLATELET
Abs Immature Granulocytes: 0.02 10*3/uL (ref 0.00–0.07)
Basophils Absolute: 0.1 10*3/uL (ref 0.0–0.1)
Basophils Relative: 1 %
Eosinophils Absolute: 0.5 10*3/uL (ref 0.0–0.5)
Eosinophils Relative: 6 %
HCT: 45.3 % (ref 36.0–46.0)
Hemoglobin: 14 g/dL (ref 12.0–15.0)
Immature Granulocytes: 0 %
Lymphocytes Relative: 34 %
Lymphs Abs: 2.6 10*3/uL (ref 0.7–4.0)
MCH: 28.9 pg (ref 26.0–34.0)
MCHC: 30.9 g/dL (ref 30.0–36.0)
MCV: 93.4 fL (ref 80.0–100.0)
Monocytes Absolute: 0.6 10*3/uL (ref 0.1–1.0)
Monocytes Relative: 8 %
Neutro Abs: 3.8 10*3/uL (ref 1.7–7.7)
Neutrophils Relative %: 51 %
Platelets: 245 10*3/uL (ref 150–400)
RBC: 4.85 MIL/uL (ref 3.87–5.11)
RDW: 12.9 % (ref 11.5–15.5)
WBC: 7.6 10*3/uL (ref 4.0–10.5)
nRBC: 0 % (ref 0.0–0.2)

## 2019-08-28 LAB — TRIGLYCERIDES: Triglycerides: 120 mg/dL (ref <150)

## 2019-08-28 LAB — PROCALCITONIN: Procalcitonin: <0.1 ng/mL

## 2019-08-28 LAB — C-REACTIVE PROTEIN: CRP: 1 mg/dL — ABNORMAL HIGH (ref <1.0)

## 2019-08-28 LAB — LACTIC ACID, PLASMA: Lactic Acid, Venous: 1.8 mmol/L (ref 0.5–1.9)

## 2019-08-28 LAB — FERRITIN: Ferritin: 46 ng/mL (ref 11–307)

## 2019-08-28 LAB — D-DIMER, QUANTITATIVE: D-Dimer, Quant: 0.56 ug/mL-FEU — ABNORMAL HIGH (ref 0.00–0.50)

## 2019-08-28 LAB — LACTATE DEHYDROGENASE: LDH: 150 U/L (ref 98–192)

## 2019-08-28 LAB — FIBRINOGEN: Fibrinogen: 423 mg/dL (ref 210–475)

## 2019-08-28 NOTE — Telephone Encounter (Signed)
  I connected by phone with Darlene Meyer on 08/28/2019 at 4:28 PM to discuss the potential use of an new treatment for mild to moderate COVID-19 viral infection in non-hospitalized patients.  This patient is a 64 y.o. female that meets the FDA criteria for Emergency Use Authorization of bamlanivimab/etesevimab or casirivimab/imdevimab.  Has a (+) direct SARS-CoV-2 viral test result  Has mild or moderate COVID-19   Is NOT hospitalized due to COVID-19  Is within 10 days of symptom onset  Has at least one of the high risk factor(s) for progression to severe COVID-19 and/or hospitalization as defined in EUA.  Specific high risk criteria : Older age (>/= 66 yo) and other comorbid conditions   I have spoken and communicated the following to the patient or parent/caregiver:  1. FDA has authorized the emergency use of bamlanivimab/etesevimab and casirivimab\imdevimab for the treatment of mild to moderate COVID-19 in adults and pediatric patients with positive results of direct SARS-CoV-2 viral testing who are 49 years of age and older weighing at least 40 kg, and who are at high risk for progressing to severe COVID-19 and/or hospitalization.  2. The significant known and potential risks and benefits of bamlanivimab/etesevimab and casirivimab\imdevimab, and the extent to which such potential risks and benefits are unknown.  3. Information on available alternative treatments and the risks and benefits of those alternatives, including clinical trials.  4. Patients treated with bamlanivimab/etesevimab and casirivimab\imdevimab should continue to self-isolate and use infection control measures (e.g., wear mask, isolate, social distance, avoid sharing personal items, clean and disinfect "high touch" surfaces, and frequent handwashing) according to CDC guidelines.   5. The patient or parent/caregiver has the option to accept or refuse bamlanivimab/etesevimab or casirivimab\imdevimab .  After reviewing  this information with the patient, The patient agreed to proceed with receiving the bamlanimivab infusion and will be provided a copy of the Fact sheet prior to receiving the infusion.Darlene Meyer 08/28/2019 4:28 PM

## 2019-08-28 NOTE — ED Notes (Signed)
Discharge paperwork reviewed with pt, pt wheeled to ED entrance to meet with ride.

## 2019-08-28 NOTE — Discharge Instructions (Addendum)
You were evaluated in the Emergency Department and after careful evaluation, we did not find any emergent condition requiring admission or further testing in the hospital.  Your exam/testing today is overall reassuring.  As discussed, we think it is important that you follow-up for the antibody therapy tomorrow.  Please return to the Emergency Department if you experience any worsening of your condition.  We encourage you to follow up with a primary care provider.  Thank you for allowing [place] to be a part of your care.

## 2019-08-28 NOTE — ED Provider Notes (Addendum)
[facility]-EMERGENCY DEPT [facility] Emergency Department Provider Note MRN:  [id number]  Arrival date & time: 08/28/19     Chief Complaint   covid positive and Shortness of Breath   History of Present Illness   Darlene Meyer is a 64 y.o. year-old female with a history of COPD, CHF, presenting to the ED with chief complaint of shortness of breath.  Patient has had 5 total days of symptoms.  Cough, malaise, headache, occasional chest pain with cough, more recently shortness of breath, trouble ambulating.  Tested positive for Covid at the [facility] few days ago.  Symptoms constant, moderate, no exacerbating or alleviating factors.  Review of Systems  A complete 10 system review of systems was obtained and all systems are negative except as noted in the HPI and PMH.   Patient's Health History    Past Medical History:  Diagnosis Date  . Arthritis    "little bit" (07/01/2012)  . Asthma   . CHF (congestive heart failure) (HCC)    "before gastric OR; not now" (07/01/2012)  . Chronic lower back pain   . COPD (chronic obstructive pulmonary disease) (HCC)   . Depression    "got off RX ~ 62yr ago" (07/01/2012)  . Fibromyalgia    "3 of my sisters have it; I have alot of the same symptoms" (07/01/2012)  . GERD (gastroesophageal reflux disease)    "before gastric OR; not now" (07/01/2012)  . Hypercholesteremia    "before gastric OR; not now" (07/01/2012)  . Hypertension    "before gastric OR; not now" (07/01/2012)  . Lung abnormality    "found spot on lung sometime last year; was to have this rechecked in 1 yr; haven't done that" (07/01/2012)  . Migraines    "very rarely" (07/01/2012)  . Pacemaker    "before gastric OR; not now" (07/01/2012)  . Sensitiveness to light    "related to migraines" (07/01/2012)  . Shortness of breath    "related to ?allergies" (07/01/2012)  . Thyroid disease     Past Surgical History:  Procedure Laterality Date  . ABDOMINAL HYSTERECTOMY  1993  . APPENDECTOMY  02/1971  . CARPAL  TUNNEL RELEASE Bilateral 2000's  . CESAREAN SECTION  1977; 1979  . ROUX-EN-Y GASTRIC BYPASS  2000's  . THYROIDECTOMY, PARTIAL  2000's   Tumor was benign    Family History  Problem Relation Age of Onset  . Cancer Mother   . Hyperlipidemia Mother   . Heart failure Mother     Social History   Socioeconomic History  . Marital status: Divorced    Spouse name: Not on file  . Number of children: Not on file  . Years of education: Not on file  . Highest education level: Not on file  Occupational History  . Not on file  Tobacco Use  . Smoking status: Current Every Day Smoker    Packs/day: 0.50    Years: 30.00    Pack years: 15.00    Types: Cigarettes  . Smokeless tobacco: Never Used  Substance and Sexual Activity  . Alcohol use: Yes    Alcohol/week: 1.0 standard drinks    Types: 1 Glasses of wine per week    Comment: Once every month or two.   . Drug use: No  . Sexual activity: Never  Other Topics Concern  . Not on file  Social History Narrative  . Not on file   Social Determinants of Health   Financial Resource Strain:   . Difficulty of Paying Living Expenses:  Food Insecurity:   . Worried About Programme researcher, broadcasting/film/video in the Last Year:   . Barista in the Last Year:   Transportation Needs:   . Freight forwarder (Medical):   Marland Kitchen Lack of Transportation (Non-Medical):   Physical Activity:   . Days of Exercise per Week:   . Minutes of Exercise per Session:   Stress:   . Feeling of Stress :   Social Connections:   . Frequency of Communication with Friends and Family:   . Frequency of Social Gatherings with Friends and Family:   . Attends Religious Services:   . Active Member of Clubs or Organizations:   . Attends Banker Meetings:   Marland Kitchen Marital Status:   Intimate Partner Violence:   . Fear of Current or Ex-Partner:   . Emotionally Abused:   Marland Kitchen Physically Abused:   . Sexually Abused:      Physical Exam   Vitals:   08/28/19 1330 08/28/19  1457  BP: (!) 143/119 (!) 120/101  Pulse: (!) 101 79  Resp: 20 19  Temp: 98.5 F (36.9 C)   SpO2: 95% 97%    CONSTITUTIONAL: Well-appearing, NAD NEURO:  Alert and oriented x 3, no focal deficits EYES:  eyes equal and reactive ENT/NECK:  no LAD, no JVD CARDIO: Regular rate, well-perfused, normal S1 and S2 PULM:  CTAB no wheezing or rhonchi, mild accessory muscle use and tachypnea GI/GU:  normal bowel sounds, non-distended, non-tender MSK/SPINE:  No gross deformities, no edema SKIN:  no rash, atraumatic PSYCH:  Appropriate speech and behavior  *Additional and/or pertinent findings included in MDM below  Diagnostic and Interventional Summary    EKG Interpretation  Date/Time:  Monday Aug 28 2019 13:44:00 EDT Ventricular Rate:  80 PR Interval:    QRS Duration: 93 QT Interval:  375 QTC Calculation: 433 R Axis:   69 Text Interpretation: Sinus rhythm Confirmed by Kennis Carina [phone]) on 08/28/2019 3:21:05 PM      Labs Reviewed  COMPREHENSIVE METABOLIC PANEL - Abnormal; Notable for the following components:      Result Value   Glucose, Bld 134 (*)    All other components within normal limits  D-DIMER, QUANTITATIVE (NOT AT [facility]) - Abnormal; Notable for the following components:   D-Dimer, Quant 0.56 (*)    All other components within normal limits  SARS CORONAVIRUS 2 BY RT PCR (HOSPITAL ORDER, PERFORMED IN [facility])  CULTURE, BLOOD (ROUTINE X 2)  CULTURE, BLOOD (ROUTINE X 2)  LACTIC ACID, PLASMA  CBC WITH DIFFERENTIAL/PLATELET  PROCALCITONIN  LACTATE DEHYDROGENASE  TRIGLYCERIDES  FIBRINOGEN  LACTIC ACID, PLASMA  FERRITIN  C-REACTIVE PROTEIN    DG Chest Port 1 View  Final Result      Medications - No data to display   Procedures  /  Critical Care Procedures  ED Course and Medical Decision Making  I have reviewed the triage vital signs, the nursing notes, and pertinent available records from the EMR.  Listed above are laboratory and imaging  tests that I personally ordered, reviewed, and interpreted and then considered in my medical decision making (see below for details).      COVID-19 with shortness of breath, early in the course of illness, multiple risk factors, concern for unfavorable trajectory, awaiting labs, will consider admission.  I witnessed the patient being ambulated and she maintained her oxygen saturations at 93% or above but was significantly symptomatic with accessory muscle use, tachypnea.  Labs overall reassuring,  D-dimer minimally elevated and when age-adjusted is negative.  However this does not change patient's functional status, will request admission for observation.  Discussed case with [facility] hospitalist, who came to evaluate the patient in consultation.  I agree that patient is overall not a candidate for Decadron or remdesivir and therefore there is little to be gained from hospitalization.  The hospitalist was able to set patient up for antibiotic therapy tomorrow and patient is agreeable with this plan.  Appropriate for discharge with strict return precautions.    Barth Kirks. Sedonia Small, MD [place] [email or web address]  Final Clinical Impressions(s) / ED Diagnoses     ICD-10-CM   1. COVID-19  U07.1   2. DOE (dyspnea on exertion)  R06.00     ED Discharge Orders    None       Discharge Instructions Discussed with and Provided to Patient:   Discharge Instructions   None       Maudie Flakes, MD 08/28/19 1523    Maudie Flakes, MD 08/28/19 [phone]

## 2019-08-28 NOTE — ED Triage Notes (Signed)
Pt covid + on Sunday. Reports last night got SOB and PCP advised to go to ED to be evaluated.

## 2019-08-28 NOTE — Consult Note (Signed)
Patient Demographics  Darlene Meyer, is a 64 y.o. female   MRN: [id number]   DOB - 1955/10/02  Admit Date - 08/28/2019    Outpatient Primary MD for the patient is Patient, No Pcp Per  Consult requested in the Hospital by Maudie Flakes, MD, On 08/28/2019    Reason for consult : COVID-19 infection  With History of -  Past Medical History:  Diagnosis Date  . Arthritis    "little bit" (07/01/2012)  . Asthma   . CHF (congestive heart failure) ([facility])    "before gastric OR; not now" (07/01/2012)  . Chronic lower back pain   . COPD (chronic obstructive pulmonary disease) ([facility])   . Depression    "got off RX ~ 38yr ago" (07/01/2012)  . Fibromyalgia    "3 of my sisters have it; I have alot of the same symptoms" (07/01/2012)  . GERD (gastroesophageal reflux disease)    "before gastric OR; not now" (07/01/2012)  . Hypercholesteremia    "before gastric OR; not now" (07/01/2012)  . Hypertension    "before gastric OR; not now" (07/01/2012)  . Lung abnormality    "found spot on lung sometime last year; was to have this rechecked in 1 yr; haven't done that" (07/01/2012)  . Migraines    "very rarely" (07/01/2012)  . Pacemaker    "before gastric OR; not now" (07/01/2012)  . Sensitiveness to light    "related to migraines" (07/01/2012)  . Shortness of breath    "related to ?allergies" (07/01/2012)  . Thyroid disease       Past Surgical History:  Procedure Laterality Date  . ABDOMINAL HYSTERECTOMY  1993  . APPENDECTOMY  02/1971  . CARPAL TUNNEL RELEASE Bilateral 2000's  . CESAREAN SECTION  1977; 1979  . ROUX-EN-Y GASTRIC BYPASS  2000's  . THYROIDECTOMY, PARTIAL  2000's   Tumor was benign    in for   Chief Complaint  Patient presents with  . covid positive  . Shortness of Breath     HPI  Darlene Meyer  is a 64 y.o. female, medical history of COPD, CHF, chronic back pain, fibromyalgia, patient presents to ED  secondary to shortness of breath, patient reports she was diagnosed with Covid about 5 days ago, report has been having symptoms going on for last 4 to 5 days, including cough, malaise, headache, chest pain related to cough, and some dyspnea mainly with activity, but she contracted the disease from her sister, who was recently at the family gathering both patient and her sister they received Pfizer Covid 19 vaccine in March, patient reports her sister has symptoms lasted only for 2 days, given the fact her symptoms has been going on for longer period of time that prompted her to come to ED. - in ED patient with no hypoxia, even with activity she maintained oxygen saturation more than 93%, inflammatory markers reassuring with CRP of 1, D-dimer of 0.56, and chest x-ray with no active disease, procalcitonin within normal limit, he is afebrile,  no tachypnea, no leukocytosis,.  Hospitalist were consulted to admit for observation    Review of Systems    In addition to the HPI above, No Fever-chills, reports malaise and fatigue She does report headache, No changes with Vision or hearing, No problems swallowing food or Liquids, Does report chest pain related to cough, she reports cough, productive, and dyspnea with activity No Abdominal pain, but she reports nausea, but no Vommitting, Bowel movements are regular, No Blood in stool or Urine, No dysuria, No new skin rashes or bruises, No new joints pains-aches,  No new weakness, tingling, numbness in any extremity, No recent weight gain or loss, No polyuria, polydypsia or polyphagia, No significant Mental Stressors.  A full 10 point Review of Systems was done, except as stated above, all other Review of Systems were negative.   Social History Social History   Tobacco Use  . Smoking status: Current Every Day Smoker    Packs/day: 0.50    Years: 30.00    Pack years: 15.00    Types: Cigarettes  . Smokeless tobacco: Never Used  Substance Use  Topics  . Alcohol use: Yes    Alcohol/week: 1.0 standard drinks    Types: 1 Glasses of wine per week    Comment: Once every month or two.      Family History Family History  Problem Relation Age of Onset  . Cancer Mother   . Hyperlipidemia Mother   . Heart failure Mother     Prior to Admission medications   Medication Sig Start Date End Date Taking? Authorizing Provider  acetaminophen (TYLENOL) 500 MG tablet Take 1,000 mg by mouth every 6 (six) hours as needed for moderate pain. For pain relief    [provider]  amoxicillin (AMOXIL) 500 MG capsule Take 2 capsules (1,000 mg total) by mouth 2 (two) times daily. Patient not taking: Reported on 08/22/2015 06/03/15   Elpidio Anis, PA-C  aspirin 325 MG tablet Take 1 tablet (325 mg total) by mouth daily. 07/03/12   Penny Pia, MD  Calcium 600-400 MG-UNIT CHEW Chew 2 tablets by mouth daily.    [provider]  cetirizine (ZYRTEC) 10 MG tablet Take 10 mg by mouth daily.      [provider]  Cholecalciferol (VITAMIN D-3 PO) Take 1 capsule by mouth daily.    [provider]  diphenhydrAMINE (BENADRYL) 25 mg capsule Take 25 mg by mouth every 6 (six) hours as needed for allergies.    [provider]  ferrous sulfate 325 (65 FE) MG tablet Take 325 mg by mouth daily with breakfast.    [provider]  ibuprofen (ADVIL,MOTRIN) 200 MG tablet Take 800 mg by mouth every 6 (six) hours as needed for mild pain or moderate pain.     [provider]  methocarbamol (ROBAXIN) 500 MG tablet Take 1-2 tablets (500-1,000 mg total) by mouth every 6 (six) hours as needed for muscle spasms. 08/22/15   Fayrene Helper, PA-C  Multiple Vitamins-Minerals (ALIVE WOMENS GUMMY) CHEW Chew 2 tablets by mouth daily.    [provider]  oxyCODONE-acetaminophen (PERCOCET/ROXICET) 5-325 MG per tablet Take 1-2 tablets by mouth every 6 (six) hours as needed for moderate pain or severe pain. Patient not taking:  Reported on 08/22/2015 04/16/14   Antony Madura, PA-C  predniSONE (DELTASONE) 20 MG tablet 3 tabs po day one, then 2 tabs daily x 4 days 08/22/15   Fayrene Helper, PA-C  traMADol (ULTRAM) 50 MG tablet Take 1 tablet (50  mg total) by mouth every 6 (six) hours as needed. 08/22/15   Fayrene Helper, PA-C  vitamin B-12 (CYANOCOBALAMIN) 1000 MCG tablet Take 2,000 mcg by mouth daily.    [provider]    Anti-infectives (From admission, onward)   None      Scheduled Meds: Continuous Infusions: PRN Meds:.  No Known Allergies  Physical Exam  Vitals  Blood pressure 104/80, pulse 80, temperature 98.5 F (36.9 C), temperature source Oral, resp. rate (!) 21, SpO2 98 %.   1. General well female, laying in bed, in no apparent distress  2. Normal affect and insight, Not Suicidal or Homicidal, Awake Alert, Oriented X 3.  3. No F.N deficits, ALL C.Nerves Intact, Strength 5/5 all 4 extremities, Sensation intact all 4 extremities, Plantars down going.  4. Ears and Eyes appear Normal, Conjunctivae clear, PERRLA. Moist Oral Mucosa.  5. Supple Neck, No JVD, No cervical lymphadenopathy appriciated, No Carotid Bruits.  6. Symmetrical Chest wall movement, Good air movement bilaterally, CTAB.  7. RRR, No Gallops, Rubs or Murmurs, No Parasternal Heave.  8. Positive Bowel Sounds, Abdomen Soft, No tenderness,  9.  No Cyanosis, Normal Skin Turgor, No Skin Rash or Bruise.  10. Good muscle tone,  joints appear normal , no effusions, Normal ROM.  Data Review  CBC Recent Labs  Lab 08/28/19 1405  WBC 7.6  HGB 14.0  HCT 45.3  PLT 245  MCV 93.4  MCH 28.9  MCHC 30.9  RDW 12.9  LYMPHSABS 2.6  MONOABS 0.6  EOSABS 0.5  BASOSABS 0.1   ------------------------------------------------------------------------------------------------------------------  Chemistries  Recent Labs  Lab 08/28/19 1405  NA 137  K 4.3  CL 103  CO2 24  GLUCOSE 134*  BUN 15  CREATININE 0.80  CALCIUM 9.4  AST 26    ALT 31  ALKPHOS 90  BILITOT 0.5   ------------------------------------------------------------------------------------------------------------------ CrCl cannot be calculated (Unknown ideal weight.). ------------------------------------------------------------------------------------------------------------------ No results for input(s): TSH, T4TOTAL, T3FREE, THYROIDAB in the last 72 hours.  Invalid input(s): FREET3   Coagulation profile No results for input(s): INR, PROTIME in the last 168 hours. ------------------------------------------------------------------------------------------------------------------- Recent Labs    08/28/19 1405  DDIMER 0.56*   -------------------------------------------------------------------------------------------------------------------  Cardiac Enzymes No results for input(s): CKMB, TROPONINI, MYOGLOBIN in the last 168 hours.  Invalid input(s): CK ------------------------------------------------------------------------------------------------------------------ Invalid input(s): POCBNP   ---------------------------------------------------------------------------------------------------------------  Urinalysis    Component Value Date/Time   COLORURINE YELLOW 07/27/2011 1507   APPEARANCEUR CLOUDY (A) 07/27/2011 1507   LABSPEC 1.016 07/27/2011 1507   PHURINE 6.0 07/27/2011 1507   GLUCOSEU NEGATIVE 07/27/2011 1507   HGBUR TRACE (A) 07/27/2011 1507   BILIRUBINUR NEGATIVE 07/27/2011 1507   KETONESUR NEGATIVE 07/27/2011 1507   PROTEINUR NEGATIVE 07/27/2011 1507   UROBILINOGEN 0.2 07/27/2011 1507   NITRITE POSITIVE (A) 07/27/2011 1507   LEUKOCYTESUR MODERATE (A) 07/27/2011 1507     Imaging results:   DG Chest Port 1 View  Result Date: 08/28/2019 CLINICAL DATA:  Shortness of breath.  COVID-19 positive. EXAM: PORTABLE CHEST 1 VIEW COMPARISON:  April 16, 2014 FINDINGS: The heart size and mediastinal contours are within normal limits.  Both lungs are clear. The visualized skeletal structures are unremarkable. IMPRESSION: No active disease. Electronically Signed   By: Gerome Sam III M.D   On: 08/28/2019 13:52      Assessment & Plan  Active Problems:   COVID-19 virus infection   COVID-19 infection -Appears to be having mild COVID-19 infection, with no evidence of COVID-19 pneumonia on imaging, no evidence of hypoxia even  with activity, and her inflammatory markers are very reassuring with D-dimers of 0.56, and CRP at 1, and I think this is likely attributed to her receiving both of her Pfizer Covid vaccine in March of this year which did prevent her from having severe disease. -Patient does not qualify for either steroids (no hypoxia), or remdesivir (as no hypoxia or pneumonia on imaging )at this point I think the best plan for course for the patient is to proceed with monoclonal antibody infusion which can only be given in outpatient setting, I have discussed with infusion team of monoclonal antibody, and they will arrange for her to receive infusion by tomorrow. -Patient can be discharged from ED, with recommendation to follow with [facility] tomorrow for monoclonal antibody infusion, and she will be given incentive spirometry on discharge and encouraged to keep using it.   Thank you for the consult   Huey Bienenstock M.D on 08/28/2019 at 4:05 PM   Thank you for the consult, we will follow the patient with you in the [facility].   [facility]   Office  [phone]

## 2019-08-28 NOTE — ED Notes (Signed)
CRITICAL VALUE STICKER  CRITICAL VALUE: COVID POS

## 2019-08-29 MED ORDER — SODIUM CHLORIDE 0.9 % IV SOLN
Freq: Once | INTRAVENOUS | Status: AC
  Filled 2019-08-29: qty 700

## 2019-08-30 ENCOUNTER — Ambulatory Visit (HOSPITAL_COMMUNITY)
Admission: RE | Admit: 2019-08-30 | Discharge: 2019-08-30 | Disposition: A | Discharge status: Home / Self Care | Payer: Medicare Other | Source: Ambulatory Visit | Attending: Pulmonary Disease | Admitting: Pulmonary Disease

## 2019-08-30 DIAGNOSIS — Z8673 Personal history of transient ischemic attack (TIA), and cerebral infarction without residual deficits: Secondary | ICD-10-CM | POA: Diagnosis not present

## 2019-08-30 DIAGNOSIS — Z23 Encounter for immunization: Secondary | ICD-10-CM | POA: Insufficient documentation

## 2019-08-30 DIAGNOSIS — U071 COVID-19: Secondary | ICD-10-CM | POA: Diagnosis not present

## 2019-08-30 DIAGNOSIS — G459 Transient cerebral ischemic attack, unspecified: Secondary | ICD-10-CM

## 2019-08-30 MED ORDER — METHYLPREDNISOLONE SODIUM SUCC 125 MG IJ SOLR: 125.0000 mg | Freq: Once | INTRAMUSCULAR | Status: DC | PRN

## 2019-08-30 MED ORDER — FAMOTIDINE IN NACL 20-0.9 MG/50ML-% IV SOLN: 20.0000 mg | Freq: Once | INTRAVENOUS | Status: DC | PRN

## 2019-08-30 MED ORDER — ALBUTEROL SULFATE HFA 108 (90 BASE) MCG/ACT IN AERS: 2.0000 | INHALATION_SPRAY | Freq: Once | RESPIRATORY_TRACT | Status: DC | PRN

## 2019-08-30 MED ORDER — EPINEPHRINE 0.3 MG/0.3ML IJ SOAJ: 0.3000 mg | Freq: Once | INTRAMUSCULAR | Status: DC | PRN

## 2019-08-30 MED ORDER — DIPHENHYDRAMINE HCL 50 MG/ML IJ SOLN: 50.0000 mg | Freq: Once | INTRAMUSCULAR | Status: DC | PRN

## 2019-08-30 MED ORDER — SODIUM CHLORIDE 0.9 % IV SOLN: INTRAVENOUS | Status: DC | PRN

## 2019-08-30 NOTE — Progress Notes (Signed)
  Diagnosis: COVID-19  Physician: Dr. Wright   Procedure: [facility]fusion [facility] Med: bamlanivimab\etesevimab infusion - Provided patient with bamlanimivab\etesevimab fact sheet for patients, parents and caregivers prior to infusion.  Complications: No immediate complications noted.  Discharge: Discharged home   Danitza Schoenfeldt  Bell 08/30/2019  

## 2019-08-30 NOTE — Discharge Instructions (Signed)

## 2019-09-02 LAB — CULTURE, BLOOD (ROUTINE X 2)
Culture: NO GROWTH
Culture: NO GROWTH
Special Requests: ADEQUATE
Special Requests: ADEQUATE

## 2020-03-12 ENCOUNTER — Encounter (HOSPITAL_COMMUNITY): Payer: Self-pay

## 2020-03-12 DIAGNOSIS — J449 Chronic obstructive pulmonary disease, unspecified: Secondary | ICD-10-CM | POA: Insufficient documentation

## 2020-03-12 DIAGNOSIS — R0602 Shortness of breath: Secondary | ICD-10-CM | POA: Diagnosis not present

## 2020-03-12 DIAGNOSIS — Z8673 Personal history of transient ischemic attack (TIA), and cerebral infarction without residual deficits: Secondary | ICD-10-CM | POA: Diagnosis not present

## 2020-03-12 DIAGNOSIS — I509 Heart failure, unspecified: Secondary | ICD-10-CM | POA: Diagnosis not present

## 2020-03-12 DIAGNOSIS — R079 Chest pain, unspecified: Secondary | ICD-10-CM | POA: Insufficient documentation

## 2020-03-12 DIAGNOSIS — Z8616 Personal history of COVID-19: Secondary | ICD-10-CM | POA: Insufficient documentation

## 2020-03-12 DIAGNOSIS — F17210 Nicotine dependence, cigarettes, uncomplicated: Secondary | ICD-10-CM | POA: Insufficient documentation

## 2020-03-12 DIAGNOSIS — F329 Major depressive disorder, single episode, unspecified: Secondary | ICD-10-CM | POA: Diagnosis not present

## 2020-03-12 DIAGNOSIS — Z950 Presence of cardiac pacemaker: Secondary | ICD-10-CM | POA: Insufficient documentation

## 2020-03-12 DIAGNOSIS — I110 Hypertensive heart disease with heart failure: Secondary | ICD-10-CM | POA: Insufficient documentation

## 2020-03-12 DIAGNOSIS — Z7982 Long term (current) use of aspirin: Secondary | ICD-10-CM | POA: Insufficient documentation

## 2020-03-12 NOTE — ED Triage Notes (Signed)
Patient arrives from home with complaint of SOB upon exertion x 2 weeks. She reports about 2 hours ago she began to have chest pain which she describes as intense pressure throughout the thoracic area. Diagnosed with COVID in may after being vaccinated and reports ongoing issues with weakness and SOB upon exertion since.

## 2020-03-13 ENCOUNTER — Emergency Department (HOSPITAL_COMMUNITY): Payer: Medicare Other

## 2020-03-13 ENCOUNTER — Emergency Department (HOSPITAL_COMMUNITY)
Admission: EM | Admit: 2020-03-13 | Discharge: 2020-03-13 | Disposition: A | Discharge status: Home / Self Care | Payer: Medicare Other | Attending: Emergency Medicine | Admitting: Emergency Medicine

## 2020-03-13 DIAGNOSIS — F32A Depression, unspecified: Secondary | ICD-10-CM

## 2020-03-13 DIAGNOSIS — R0602 Shortness of breath: Secondary | ICD-10-CM

## 2020-03-13 LAB — TROPONIN I (HIGH SENSITIVITY)
Troponin I (High Sensitivity): 3 ng/L (ref <18)
Troponin I (High Sensitivity): 3 ng/L (ref <18)

## 2020-03-13 LAB — CBC
HCT: 49 % — ABNORMAL HIGH (ref 36.0–46.0)
Hemoglobin: 14.7 g/dL (ref 12.0–15.0)
MCH: 28.8 pg (ref 26.0–34.0)
MCHC: 30 g/dL (ref 30.0–36.0)
MCV: 96.1 fL (ref 80.0–100.0)
Platelets: 252 10*3/uL (ref 150–400)
RBC: 5.1 MIL/uL (ref 3.87–5.11)
RDW: 12.8 % (ref 11.5–15.5)
WBC: 9.9 10*3/uL (ref 4.0–10.5)
nRBC: 0 % (ref 0.0–0.2)

## 2020-03-13 LAB — D-DIMER, QUANTITATIVE: D-Dimer, Quant: 0.34 ug/mL-FEU (ref 0.00–0.50)

## 2020-03-13 LAB — BASIC METABOLIC PANEL
Anion gap: 7 (ref 5–15)
BUN: 16 mg/dL (ref 8–23)
CO2: 24 mmol/L (ref 22–32)
Calcium: 10 mg/dL (ref 8.9–10.3)
Chloride: 104 mmol/L (ref 98–111)
Creatinine, Ser: 0.76 mg/dL (ref 0.44–1.00)
GFR, Estimated: >60 mL/min (ref >60)
Glucose, Bld: 161 mg/dL — ABNORMAL HIGH (ref 70–99)
Potassium: 4.5 mmol/L (ref 3.5–5.1)
Sodium: 135 mmol/L (ref 135–145)

## 2020-03-13 LAB — BRAIN NATRIURETIC PEPTIDE: B Natriuretic Peptide: 44.7 pg/mL (ref 0.0–100.0)

## 2020-03-13 MED ORDER — PREDNISONE 20 MG PO TABS
60.0000 mg | ORAL_TABLET | Freq: Once | ORAL | Status: AC
  Administered 2020-03-13: 60 mg via ORAL
  Filled 2020-03-13: qty 3

## 2020-03-13 MED ORDER — PREDNISONE 50 MG PO TABS: 50.0000 mg | ORAL_TABLET | Freq: Every day | ORAL | 0 refills | Status: AC

## 2020-03-13 MED ORDER — ALBUTEROL SULFATE HFA 108 (90 BASE) MCG/ACT IN AERS
2.0000 | INHALATION_SPRAY | Freq: Once | RESPIRATORY_TRACT | Status: AC
  Administered 2020-03-13: 2 via RESPIRATORY_TRACT
  Filled 2020-03-13: qty 6.7

## 2020-03-13 NOTE — ED Provider Notes (Signed)
[facility]-EMERGENCY DEPT Provider Note   CSN: [id number] Arrival date & time: 03/12/20  2334   History Chief Complaint  Patient presents with   Shortness of Breath   Chest Pain    Darlene Meyer is a 64 y.o. female.  The history is provided by the patient.  Shortness of Breath Associated symptoms: chest pain   Chest Pain Associated symptoms: shortness of breath   She has history of hypertension, hyperlipidemia, COPD, heart failure comes in with increasing dyspnea on exertion over the last 2 weeks.  She is now having dyspnea with even minimal exertion.  She has had episodes of paroxysmal nocturnal dyspnea.  She has also noted orthopnea.  Tonight, she had an episode of chest discomfort which was described as an achy feeling across the lower chest without associated nausea or diaphoresis.  She is concerned because she has been having symptoms of long Covid since being infected with Covid last May.  She is currently denying cough, fever, chills.  Past Medical History:  Diagnosis Date   Arthritis    "little bit" (07/01/2012)   Asthma    CHF (congestive heart failure) ([facility])    "before gastric OR; not now" (07/01/2012)   Chronic lower back pain    COPD (chronic obstructive pulmonary disease) ([facility])    Depression    "got off RX ~ 45yr ago" (07/01/2012)   Fibromyalgia    "3 of my sisters have it; I have alot of the same symptoms" (07/01/2012)   GERD (gastroesophageal reflux disease)    "before gastric OR; not now" (07/01/2012)   Hypercholesteremia    "before gastric OR; not now" (07/01/2012)   Hypertension    "before gastric OR; not now" (07/01/2012)   Lung abnormality    "found spot on lung sometime last year; was to have this rechecked in 1 yr; haven't done that" (07/01/2012)   Migraines    "very rarely" (07/01/2012)   Pacemaker    "before gastric OR; not now" (07/01/2012)   Sensitiveness to light    "related to migraines" (07/01/2012)   Shortness of breath     "related to ?allergies" (07/01/2012)   Thyroid disease     Patient Active Problem List   Diagnosis Date Noted   COVID-19 virus infection 08/28/2019   Seasonal allergies 07/02/2012   TIA (transient ischemic attack) 07/02/2012   Numbness and tingling of right arm and leg 07/01/2012   Numbness and tingling of right face 07/01/2012   Nicotine dependence 07/01/2012    Past Surgical History:  Procedure Laterality Date   ABDOMINAL HYSTERECTOMY  1993   APPENDECTOMY  02/1971   CARPAL TUNNEL RELEASE Bilateral 2000's   CESAREAN SECTION  1977; 1979   ROUX-EN-Y GASTRIC BYPASS  2000's   THYROIDECTOMY, PARTIAL  2000's   Tumor was benign     OB History   No obstetric history on file.     Family History  Problem Relation Age of Onset   Cancer Mother    Hyperlipidemia Mother    Heart failure Mother     Social History   Tobacco Use   Smoking status: Current Every Day Smoker    Packs/day: 0.50    Years: 30.00    Pack years: 15.00    Types: Cigarettes   Smokeless tobacco: Never Used  Substance Use Topics   Alcohol use: Yes    Alcohol/week: 1.0 standard drink    Types: 1 Glasses of wine per week    Comment: Once every month or  two.    Drug use: No    Home Medications Prior to Admission medications   Medication Sig Start Date End Date Taking? Authorizing Provider  acetaminophen (TYLENOL) 500 MG tablet Take 1,000 mg by mouth every 6 (six) hours as needed for moderate pain. For pain relief   Yes [provider]  albuterol (VENTOLIN HFA) 108 (90 Base) MCG/ACT inhaler Inhale 1 puff into the lungs every 6 (six) hours as needed for wheezing or shortness of breath.  06/08/19 06/07/20 Yes [provider]  cetirizine (ZYRTEC) 10 MG tablet Take 10 mg by mouth daily.   Yes [provider]  cholecalciferol (VITAMIN D3) 25 MCG (1000 UNIT) tablet Take 5,000 Units by mouth daily.   Yes [provider]  diphenhydrAMINE (BENADRYL) 25 mg capsule  Take 50 mg by mouth every 6 (six) hours as needed for allergies.    Yes [provider]  FLUoxetine (PROZAC) 20 MG capsule Take 20 mg by mouth daily.   Yes [provider]  fluticasone (FLONASE) 50 MCG/ACT nasal spray Place 1 spray into both nostrils daily.   Yes [provider]  guaiFENesin (MUCINEX) 600 MG 12 hr tablet Take 600 mg by mouth daily as needed for cough or to loosen phlegm.   Yes [provider]  ibuprofen (ADVIL,MOTRIN) 200 MG tablet Take 400 mg by mouth every 6 (six) hours as needed for mild pain or moderate pain.    Yes [provider]  Multiple Vitamins-Minerals (ALIVE WOMENS GUMMY) CHEW Chew 2 tablets by mouth daily.   Yes [provider]  aspirin EC 81 MG tablet Take 81 mg by mouth daily.    [provider]  clonazePAM (KLONOPIN) 0.5 MG tablet Take 0.25 mg by mouth as needed for anxiety.  08/11/19   [provider]    Allergies    Shellfish allergy  Review of Systems   Review of Systems  Respiratory: Positive for shortness of breath.   Cardiovascular: Positive for chest pain.  All other systems reviewed and are negative.   Physical Exam Updated Vital Signs BP 135/74    Pulse 70    Temp 97.6 F (36.4 C) (Oral)    Resp 16    Ht 5\' 2"  (1.575 m)    Wt 108 kg    SpO2 97%    BMI 43.53 kg/m   Physical Exam Vitals and nursing note reviewed.   64 year old female, resting comfortably and in no acute distress. Vital signs are normal. Oxygen saturation is 97%, which is normal. Head is normocephalic and atraumatic. PERRLA, EOMI. Oropharynx is clear. Neck is nontender and supple without adenopathy or JVD. Back is nontender and there is no CVA tenderness. Lungs are clear without rales, wheezes, or rhonchi.  With forced exhalation, slight wheezes noted. Chest is nontender. Heart has regular rate and rhythm without murmur. Abdomen is soft, flat, nontender without masses or hepatosplenomegaly and peristalsis  is normoactive. Extremities have trace edema, full range of motion is present. Skin is warm and dry without rash. Neurologic: Mental status is normal, cranial nerves are intact, there are no motor or sensory deficits.  ED Results / Procedures / Treatments   Labs (all labs ordered are listed, but only abnormal results are displayed) Labs Reviewed  BASIC METABOLIC PANEL - Abnormal; Notable for the following components:      Result Value   Glucose, Bld 161 (*)    All other components within normal limits  CBC - Abnormal; Notable for  the following components:   HCT 49.0 (*)    All other components within normal limits  D-DIMER, QUANTITATIVE (NOT AT [facility])  BRAIN NATRIURETIC PEPTIDE  TROPONIN I (HIGH SENSITIVITY)  TROPONIN I (HIGH SENSITIVITY)    EKG EKG Interpretation  Date/Time:  Tuesday March 12 2020 23:41:50 EST Ventricular Rate:  79 PR Interval:    QRS Duration: 101 QT Interval:  368 QTC Calculation: 422 R Axis:   65 Text Interpretation: Sinus rhythm Normal ECG When compared with ECG of 08/28/2019, No significant change was found Confirmed by Dione Booze ([id number]) on 03/13/2020 12:17:25 AM   Radiology DG Chest 2 View  Result Date: 03/13/2020 CLINICAL DATA:  Chest pain EXAM: CHEST - 2 VIEW COMPARISON:  None. FINDINGS: Lungs are well expanded, symmetric, and clear. No pneumothorax or pleural effusion. Cardiac size within normal limits. Pulmonary vascularity is normal. Osseous structures are age-appropriate. No acute bone abnormality. IMPRESSION: No active cardiopulmonary disease. Electronically Signed   By: Helyn Numbers MD   On: 03/13/2020 00:17    Procedures Procedures  Medications Ordered in ED Medications  albuterol (VENTOLIN HFA) 108 (90 Base) MCG/ACT inhaler 2 puff (2 puffs Inhalation Given 03/13/20 0306)  predniSONE (DELTASONE) tablet 60 mg (60 mg Oral Given 03/13/20 0426)    ED Course  I have reviewed the triage vital signs and the nursing  notes.  Pertinent labs & imaging results that were available during my care of the patient were reviewed by me and considered in my medical decision making (see chart for details).  MDM Rules/Calculators/A&P Dyspnea on exertion worrisome for heart failure.  ECG is normal and chest x-ray shows no acute process.  Troponin is normal.  Will check BNP.  Also, given problems with long Covid, will check D-dimer.  With mild wheezing noted on exam, will give therapeutic trial of albuterol inhaler.  Old records are reviewed confirming episode of COVID-19 last May.  BNP and D-dimer are both normal.  Patient did notice some improvement with albuterol inhaler.  At this point, patient states that she is allergic to animals and there are animals at the building where she is living and is worried she might be having an allergy.  She also admits to having significant depression.  She is ambulated in the emergency department without any oxygen desaturation.  She is felt to be safe for discharge.  She will be discharged with a short course of prednisone and will be referred to pulmonology for further evaluation.  Given outpatient resources for mental health services.  Final Clinical Impression(s) / ED Diagnoses Final diagnoses:  Shortness of breath  Depression, unspecified depression type    Rx / DC Orders ED Discharge Orders         Ordered    predniSONE (DELTASONE) 50 MG tablet  Daily        03/13/20 0421    Ambulatory referral to Pulmonology       Comments: Unexplained dyspnea   03/13/20 0421           Dione Booze, MD 03/13/20 [phone]

## 2020-03-13 NOTE — Discharge Instructions (Addendum)
Your evaluation today did not show any signs of pneumonia, heart failure, blood clots in the lungs.  Your oxygen levels were adequate throughout your entire stay in the emergency department.  Please follow-up with the lung specialist for further evaluation of why you have been feeling so short of breath.  Use the inhaler - two puffs every 4 hours - as needed for cough or shortness of breath.  Return to the emergency department if symptoms are getting worse.

## 2021-04-27 IMAGING — CR DG CHEST 2V
2 series · 2 of 2 positions shown · non-contrast
Comparison: None.

CLINICAL DATA: Chest pain

EXAM:
CHEST - 2 VIEW

[w chest lat]
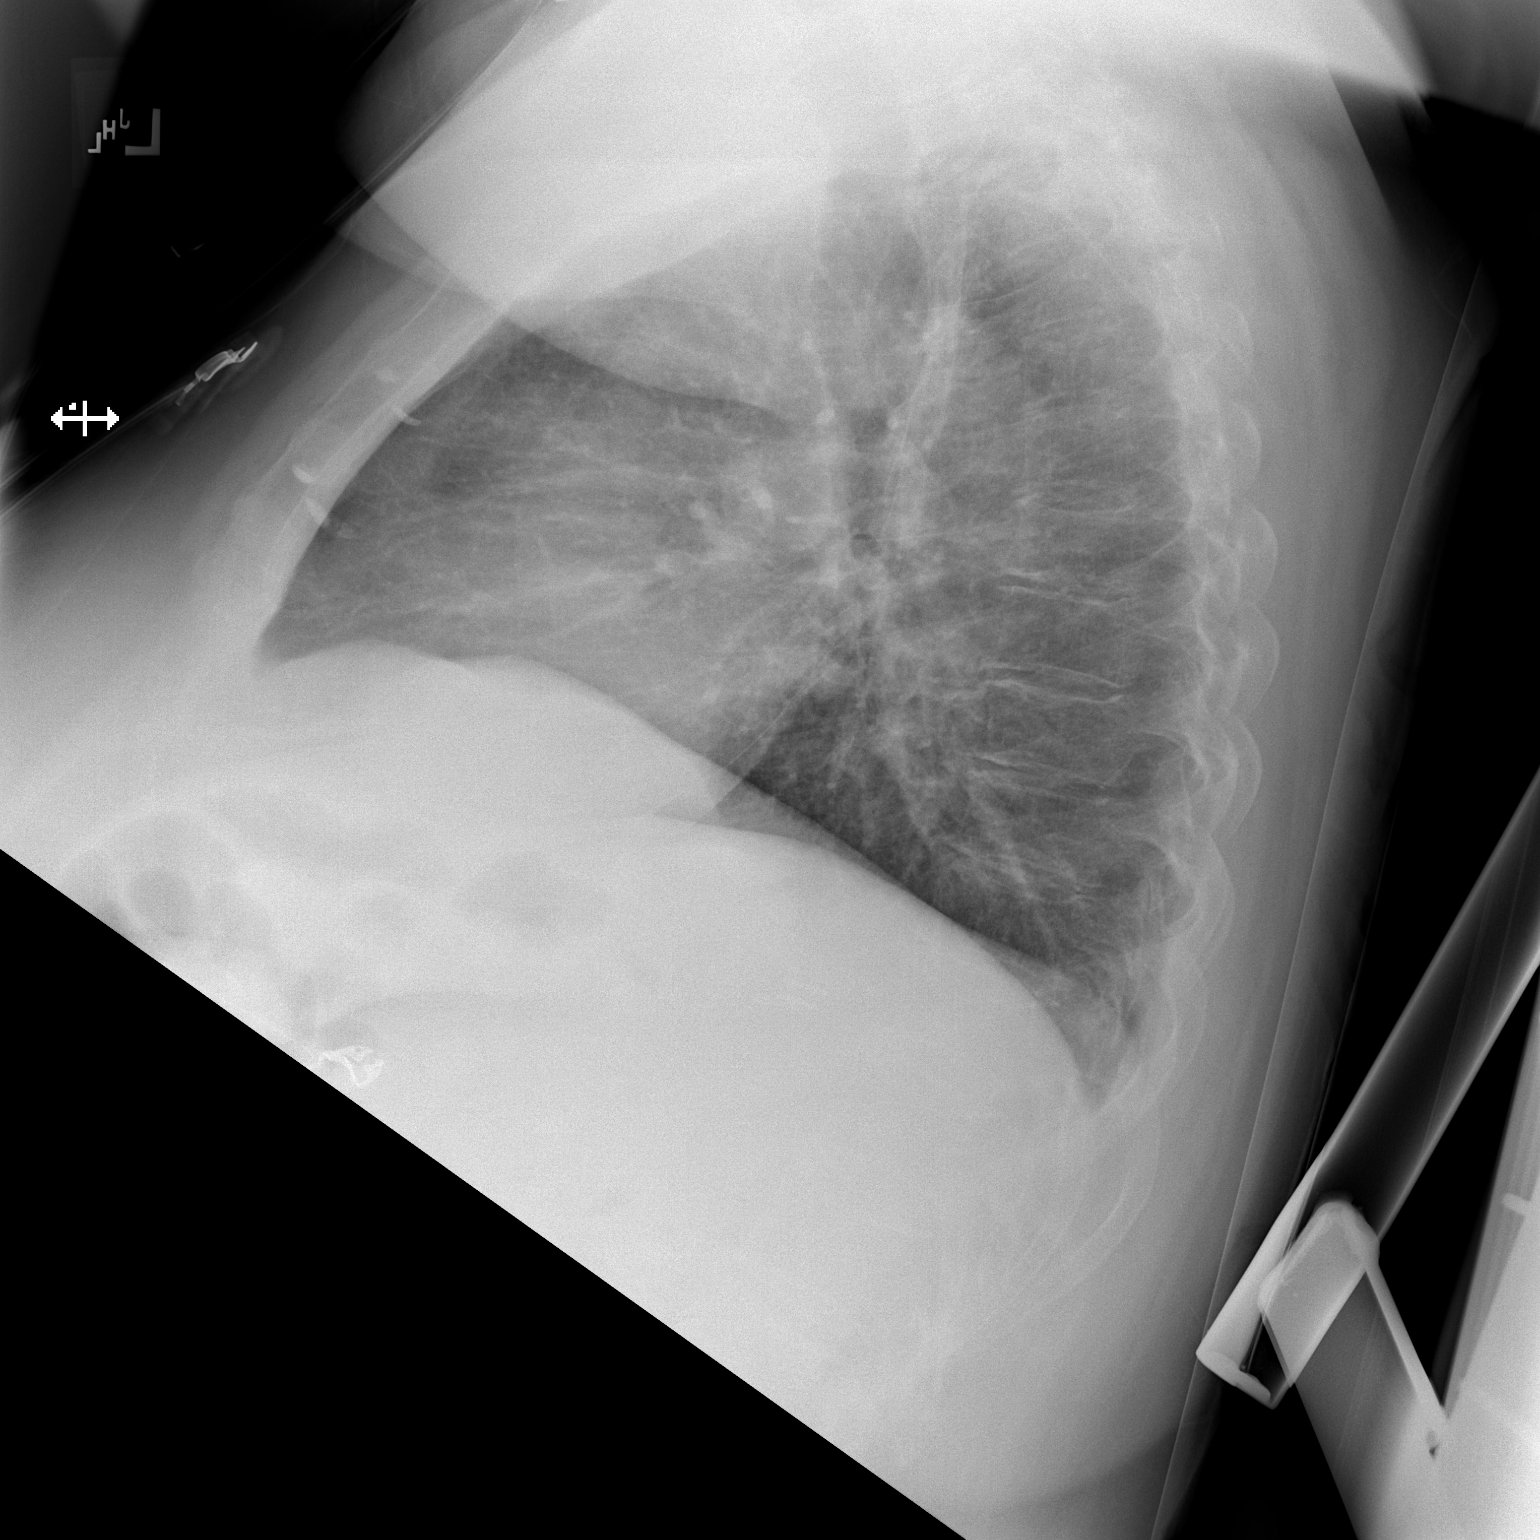

[x chest ap]
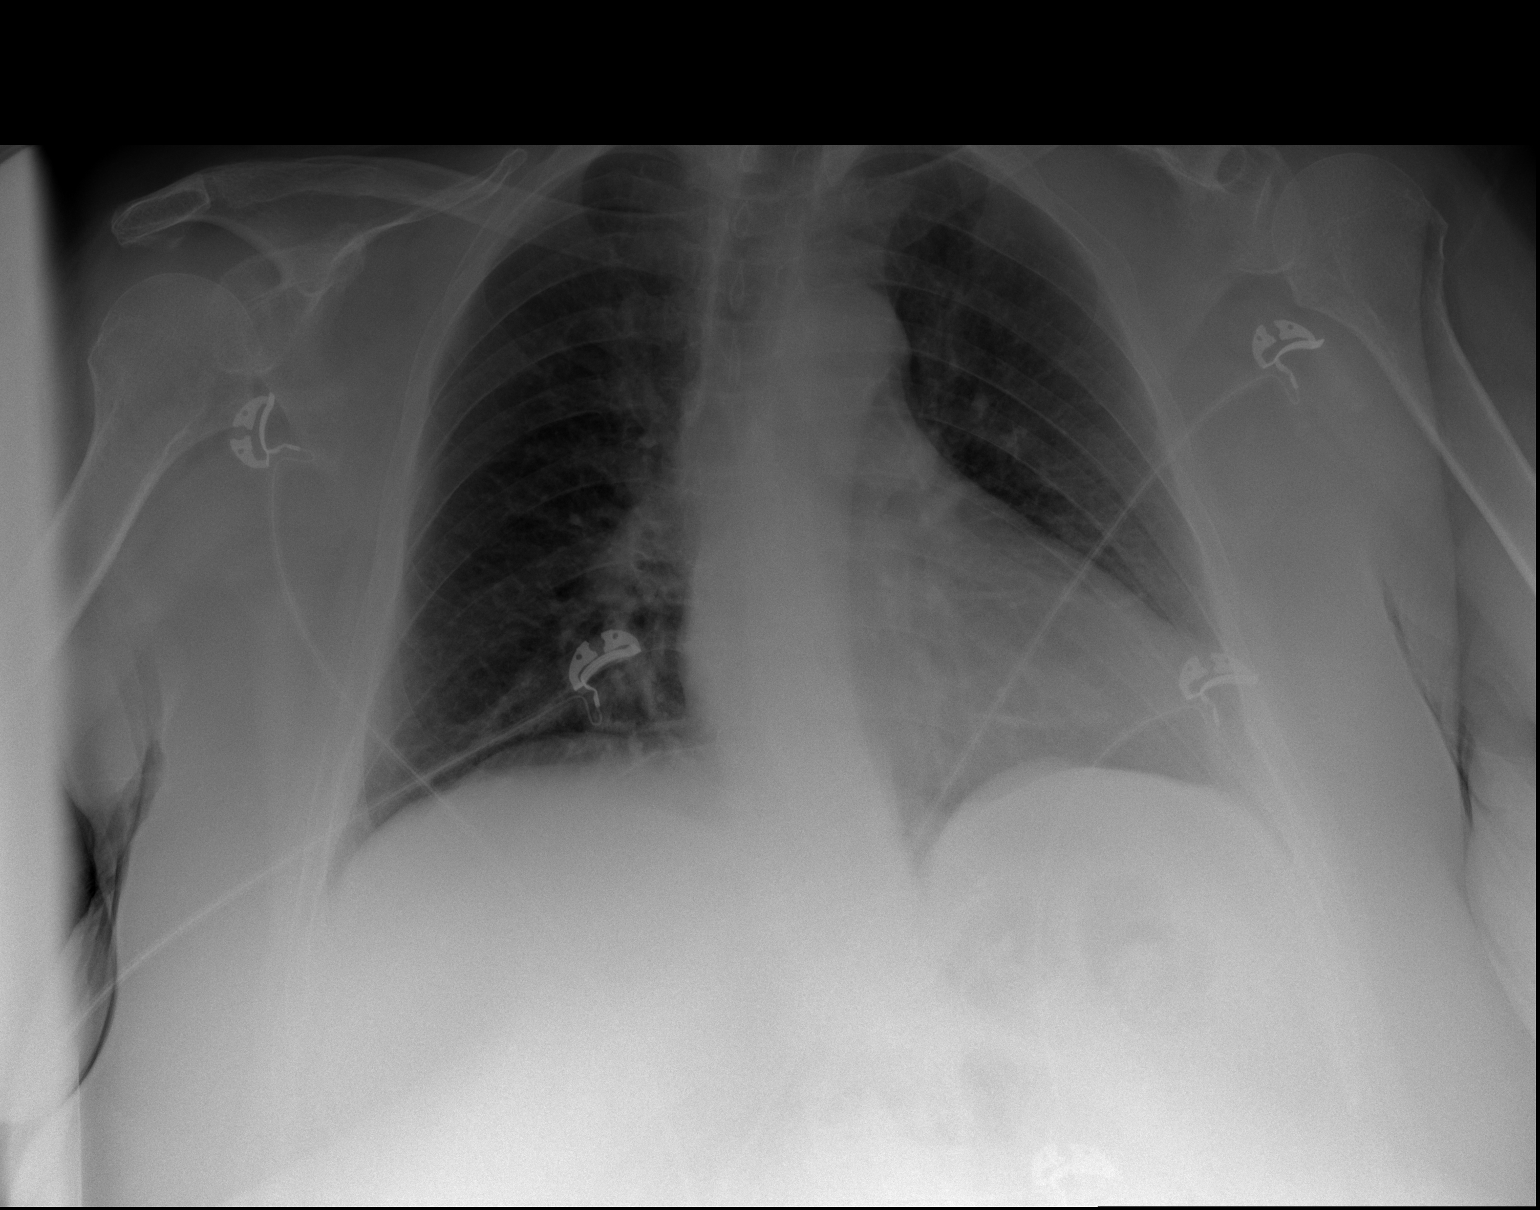

[2 of 2 positions shown; findings below may reference images not displayed]

FINDINGS: Lungs are well expanded, symmetric, and clear. No pneumothorax or
pleural effusion. Cardiac size within normal limits. Pulmonary
vascularity is normal. Osseous structures are age-appropriate. No
acute bone abnormality.
IMPRESSION: No active cardiopulmonary disease.
